# Patient Record
Sex: Female | Born: 1940 | Race: White | Hispanic: No | Marital: Single | Smoking: Never smoker
Health system: Southern US, Community
[De-identification: ages and names within clinical notes are randomized; demographics above are authoritative.]

## PROBLEM LIST (undated history)

## (undated) DIAGNOSIS — R569 Unspecified convulsions: Secondary | ICD-10-CM

## (undated) DIAGNOSIS — M40209 Unspecified kyphosis, site unspecified: Secondary | ICD-10-CM

## (undated) DIAGNOSIS — M4300 Spondylolysis, site unspecified: Secondary | ICD-10-CM

## (undated) DIAGNOSIS — S22080A Wedge compression fracture of T11-T12 vertebra, initial encounter for closed fracture: Secondary | ICD-10-CM

## (undated) DIAGNOSIS — M419 Scoliosis, unspecified: Secondary | ICD-10-CM

## (undated) DIAGNOSIS — G809 Cerebral palsy, unspecified: Secondary | ICD-10-CM

## (undated) DIAGNOSIS — F72 Severe intellectual disabilities: Secondary | ICD-10-CM

## (undated) SURGERY — OPEN REDUCTION OF HIP
Anesthesia: Choice | Laterality: Right

---

## 1998-01-31 ENCOUNTER — Other Ambulatory Visit: Admission: RE | Admit: 1998-01-31 | Discharge: 1998-01-31 | Payer: Self-pay | Admitting: Obstetrics and Gynecology

## 1998-02-03 ENCOUNTER — Encounter (HOSPITAL_COMMUNITY): Admission: RE | Admit: 1998-02-03 | Discharge: 1998-05-04 | Payer: Self-pay | Admitting: Family Medicine

## 1998-05-19 ENCOUNTER — Encounter (HOSPITAL_COMMUNITY): Admission: RE | Admit: 1998-05-19 | Discharge: 1998-08-17 | Payer: Self-pay | Admitting: Family Medicine

## 1999-08-23 ENCOUNTER — Encounter: Admission: RE | Admit: 1999-08-23 | Discharge: 1999-08-23 | Payer: Self-pay | Admitting: Family Medicine

## 2000-07-05 ENCOUNTER — Encounter: Payer: Self-pay | Admitting: Family Medicine

## 2000-07-05 ENCOUNTER — Encounter: Admission: RE | Admit: 2000-07-05 | Discharge: 2000-07-05 | Payer: Self-pay | Admitting: Family Medicine

## 2001-08-05 ENCOUNTER — Encounter: Payer: Self-pay | Admitting: Family Medicine

## 2001-08-05 ENCOUNTER — Encounter: Admission: RE | Admit: 2001-08-05 | Discharge: 2001-08-05 | Payer: Self-pay | Admitting: Family Medicine

## 2002-12-08 ENCOUNTER — Encounter: Payer: Self-pay | Admitting: Family Medicine

## 2002-12-08 ENCOUNTER — Encounter: Admission: RE | Admit: 2002-12-08 | Discharge: 2002-12-08 | Payer: Self-pay | Admitting: Family Medicine

## 2003-10-15 ENCOUNTER — Encounter: Admission: RE | Admit: 2003-10-15 | Discharge: 2003-10-15 | Payer: Self-pay | Admitting: Family Medicine

## 2005-02-28 ENCOUNTER — Encounter: Admission: RE | Admit: 2005-02-28 | Discharge: 2005-02-28 | Payer: Self-pay | Admitting: Family Medicine

## 2006-09-23 ENCOUNTER — Emergency Department (HOSPITAL_COMMUNITY): Admission: EM | Admit: 2006-09-23 | Discharge: 2006-09-23 | Payer: Self-pay | Admitting: Family Medicine

## 2007-03-26 ENCOUNTER — Encounter: Admission: RE | Admit: 2007-03-26 | Discharge: 2007-03-26 | Payer: Self-pay | Admitting: Family Medicine

## 2007-05-26 ENCOUNTER — Encounter: Admission: RE | Admit: 2007-05-26 | Discharge: 2007-05-26 | Payer: Self-pay | Admitting: Family Medicine

## 2008-03-26 ENCOUNTER — Encounter: Admission: RE | Admit: 2008-03-26 | Discharge: 2008-03-26 | Payer: Self-pay | Admitting: Family Medicine

## 2008-06-30 ENCOUNTER — Encounter: Admission: RE | Admit: 2008-06-30 | Discharge: 2008-06-30 | Payer: Self-pay | Admitting: Family Medicine

## 2009-03-01 ENCOUNTER — Encounter (INDEPENDENT_AMBULATORY_CARE_PROVIDER_SITE_OTHER): Payer: Self-pay | Admitting: Emergency Medicine

## 2009-03-01 ENCOUNTER — Encounter: Admission: RE | Admit: 2009-03-01 | Discharge: 2009-03-01 | Payer: Self-pay | Admitting: Family Medicine

## 2009-03-01 ENCOUNTER — Emergency Department (HOSPITAL_COMMUNITY): Admission: EM | Admit: 2009-03-01 | Discharge: 2009-03-01 | Payer: Self-pay | Admitting: Emergency Medicine

## 2009-03-01 ENCOUNTER — Ambulatory Visit: Payer: Self-pay | Admitting: Vascular Surgery

## 2009-03-09 ENCOUNTER — Encounter: Admission: RE | Admit: 2009-03-09 | Discharge: 2009-03-09 | Payer: Self-pay | Admitting: Family Medicine

## 2009-03-29 ENCOUNTER — Encounter: Admission: RE | Admit: 2009-03-29 | Discharge: 2009-03-29 | Payer: Self-pay | Admitting: Family Medicine

## 2009-05-18 ENCOUNTER — Encounter: Admission: RE | Admit: 2009-05-18 | Discharge: 2009-05-18 | Payer: Self-pay | Admitting: Family Medicine

## 2009-06-08 ENCOUNTER — Encounter: Admission: RE | Admit: 2009-06-08 | Discharge: 2009-06-08 | Payer: Self-pay | Admitting: Family Medicine

## 2009-12-26 ENCOUNTER — Encounter: Admission: RE | Admit: 2009-12-26 | Discharge: 2009-12-26 | Payer: Self-pay | Admitting: Family Medicine

## 2009-12-28 ENCOUNTER — Emergency Department (HOSPITAL_COMMUNITY): Admission: EM | Admit: 2009-12-28 | Discharge: 2009-12-28 | Payer: Self-pay | Admitting: Emergency Medicine

## 2010-03-29 ENCOUNTER — Encounter: Admission: RE | Admit: 2010-03-29 | Discharge: 2010-03-29 | Payer: Self-pay | Admitting: Family Medicine

## 2010-09-21 ENCOUNTER — Encounter: Admission: RE | Admit: 2010-09-21 | Discharge: 2010-09-21 | Payer: Self-pay | Admitting: Orthopaedic Surgery

## 2010-11-25 ENCOUNTER — Encounter: Payer: Self-pay | Admitting: Orthopaedic Surgery

## 2010-11-25 ENCOUNTER — Encounter: Payer: Self-pay | Admitting: Family Medicine

## 2010-11-26 ENCOUNTER — Encounter: Payer: Self-pay | Admitting: Family Medicine

## 2010-11-27 ENCOUNTER — Emergency Department (HOSPITAL_COMMUNITY)
Admission: EM | Admit: 2010-11-27 | Discharge: 2010-11-27 | Payer: Self-pay | Source: Home / Self Care | Admitting: Emergency Medicine

## 2011-01-09 IMAGING — CR DG FOOT COMPLETE 3+V*L*
3 series · 3 of 3 positions shown · non-contrast
Comparison: None

CLINICAL DATA: Left foot pain and swelling no known injury

LEFT FOOT - COMPLETE 3+ VIEW

[view not recorded (1 of 3)]
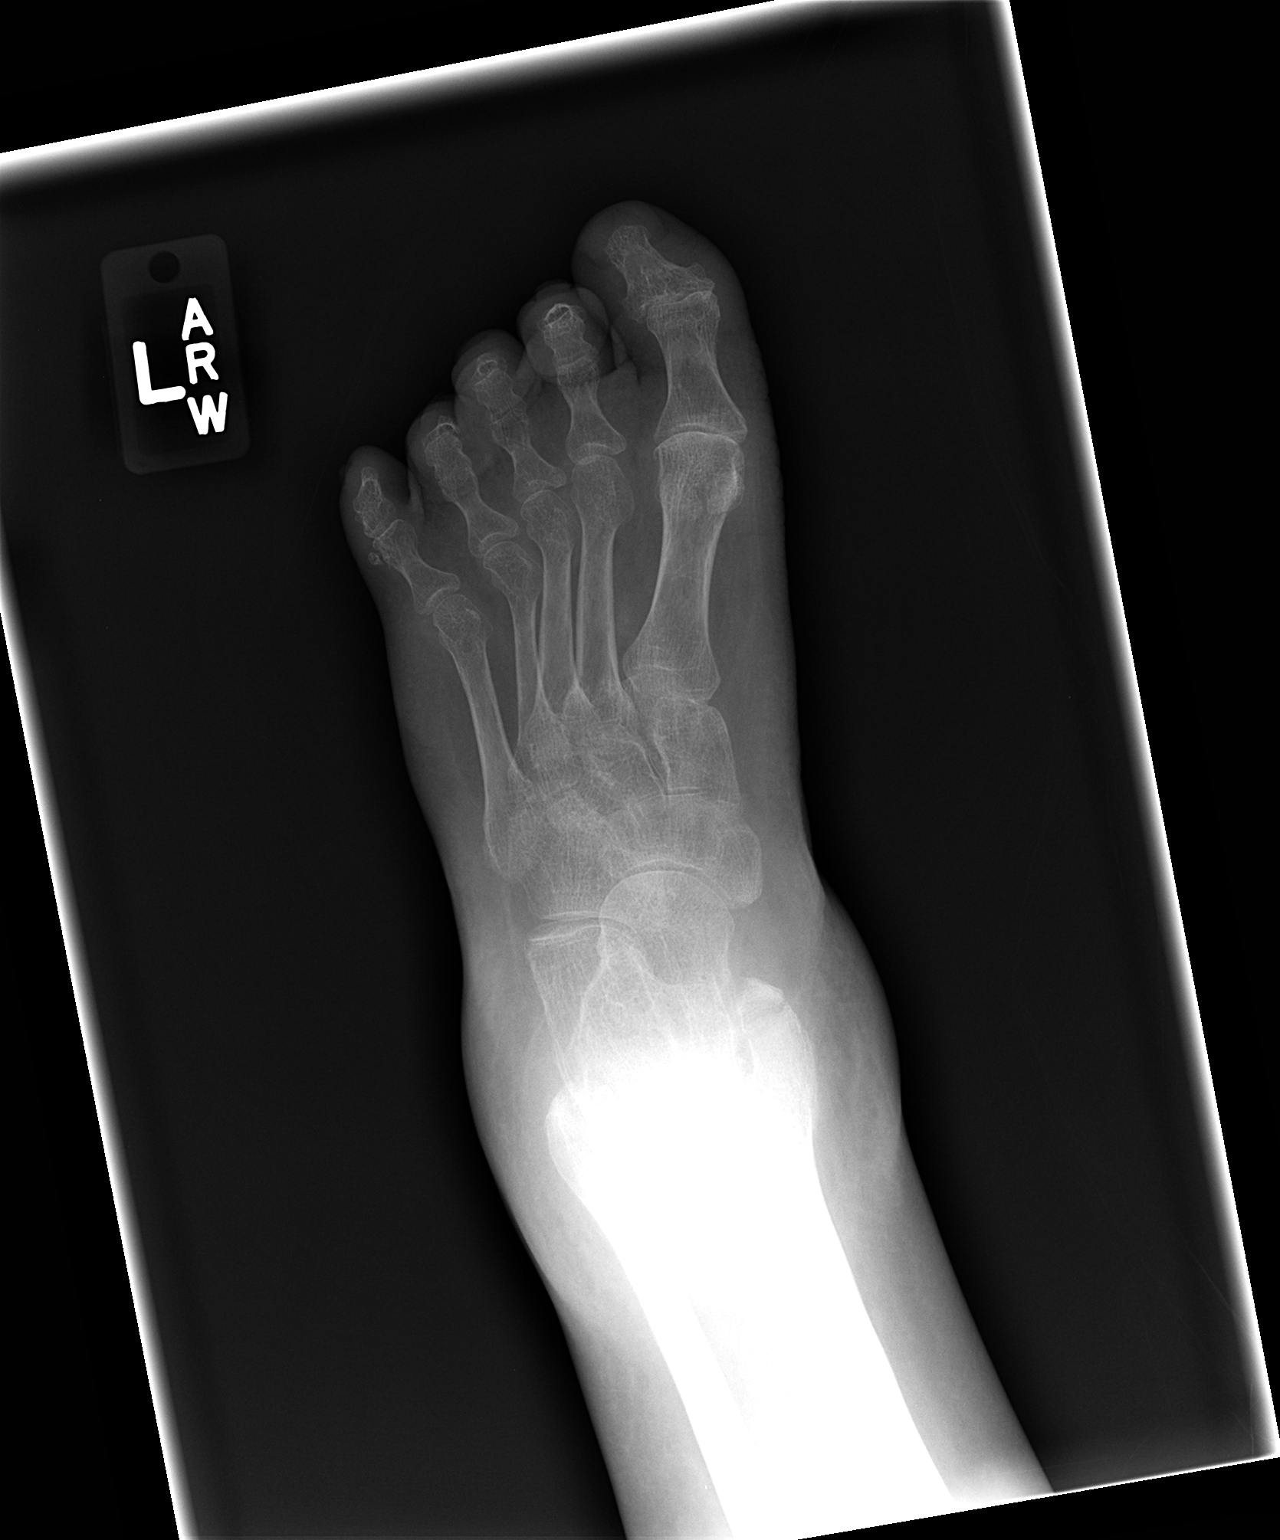

[view not recorded (2 of 3)]
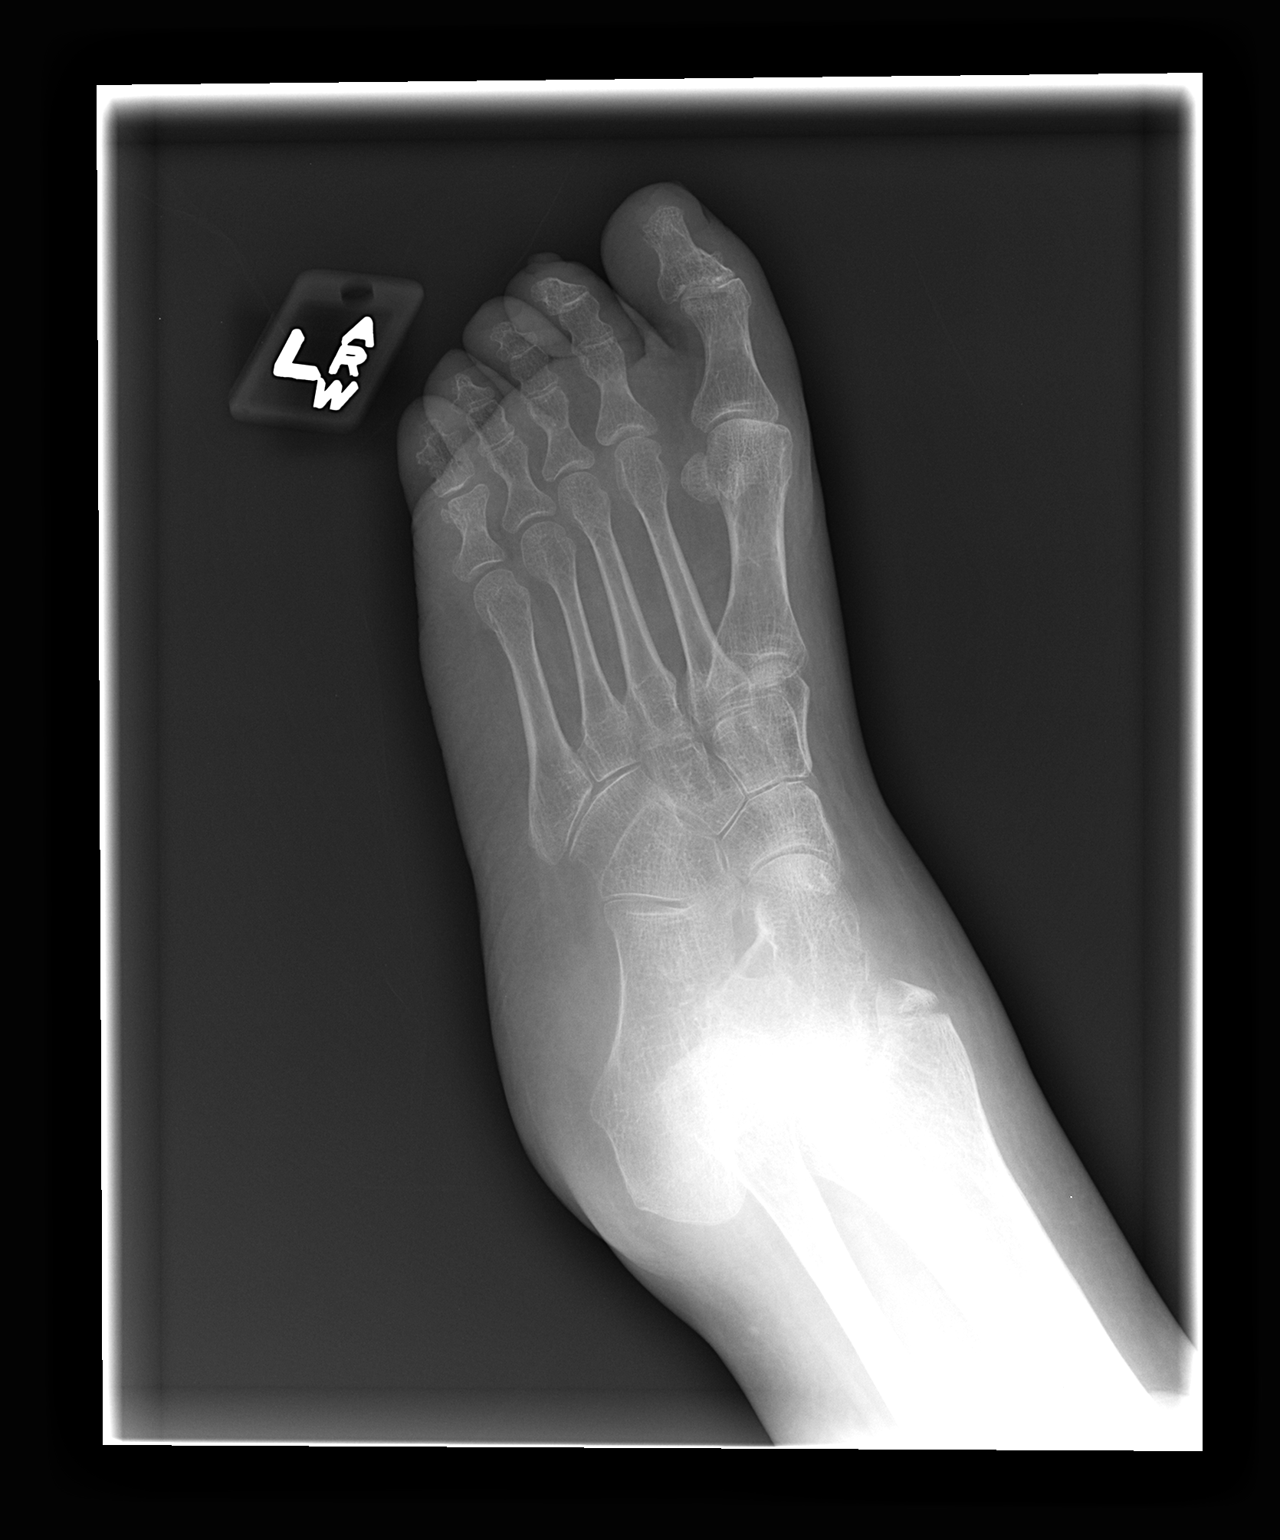

[view not recorded (3 of 3)]
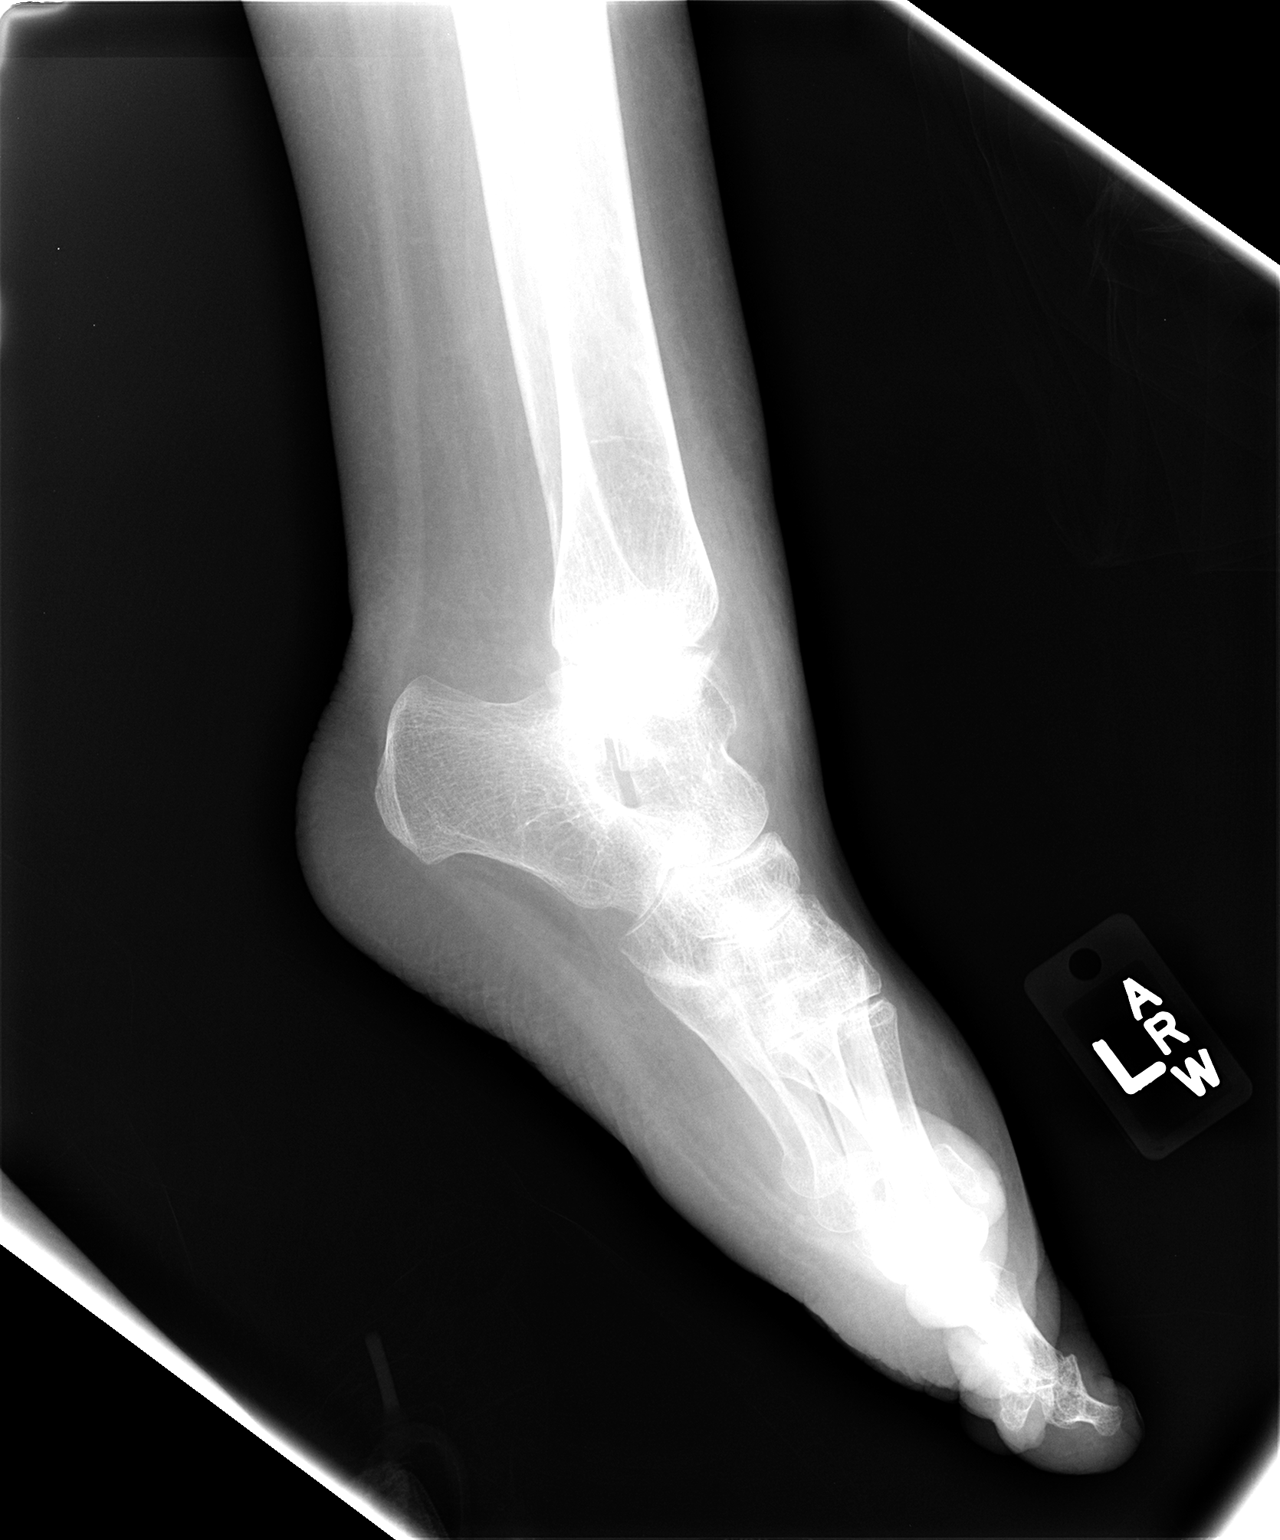

[3 of 3 positions shown; findings below may reference images not displayed]

FINDINGS: Three views provided.  No acute fracture or subluxation.
Diffuse osteopenia.  No radiopaque foreign body.  Soft tissue
swelling is noted dorsally in metatarsal region.
IMPRESSION: No acute fracture or subluxation.  Diffuse osteopenia.

## 2011-01-25 ENCOUNTER — Emergency Department (HOSPITAL_COMMUNITY)
Admission: EM | Admit: 2011-01-25 | Discharge: 2011-01-25 | Disposition: A | Discharge status: Home / Self Care | Payer: No Typology Code available for payment source | Attending: Emergency Medicine | Admitting: Emergency Medicine

## 2011-01-25 DIAGNOSIS — F79 Unspecified intellectual disabilities: Secondary | ICD-10-CM | POA: Insufficient documentation

## 2011-01-25 DIAGNOSIS — Y92410 Unspecified street and highway as the place of occurrence of the external cause: Secondary | ICD-10-CM | POA: Insufficient documentation

## 2011-01-25 DIAGNOSIS — T1490XA Injury, unspecified, initial encounter: Secondary | ICD-10-CM | POA: Insufficient documentation

## 2011-02-26 ENCOUNTER — Other Ambulatory Visit: Payer: Self-pay | Admitting: Family Medicine

## 2011-02-26 DIAGNOSIS — Z1231 Encounter for screening mammogram for malignant neoplasm of breast: Secondary | ICD-10-CM

## 2011-04-05 ENCOUNTER — Ambulatory Visit: Payer: No Typology Code available for payment source

## 2011-04-24 ENCOUNTER — Ambulatory Visit: Payer: No Typology Code available for payment source

## 2011-12-21 DIAGNOSIS — Z79899 Other long term (current) drug therapy: Secondary | ICD-10-CM | POA: Insufficient documentation

## 2011-12-21 DIAGNOSIS — M4000 Postural kyphosis, site unspecified: Secondary | ICD-10-CM | POA: Insufficient documentation

## 2011-12-21 DIAGNOSIS — M810 Age-related osteoporosis without current pathological fracture: Secondary | ICD-10-CM | POA: Insufficient documentation

## 2011-12-21 DIAGNOSIS — W2209XA Striking against other stationary object, initial encounter: Secondary | ICD-10-CM | POA: Insufficient documentation

## 2011-12-21 DIAGNOSIS — Y9210 Unspecified residential institution as the place of occurrence of the external cause: Secondary | ICD-10-CM | POA: Insufficient documentation

## 2011-12-21 DIAGNOSIS — S0100XA Unspecified open wound of scalp, initial encounter: Secondary | ICD-10-CM | POA: Insufficient documentation

## 2011-12-21 DIAGNOSIS — F72 Severe intellectual disabilities: Secondary | ICD-10-CM | POA: Insufficient documentation

## 2011-12-21 DIAGNOSIS — G809 Cerebral palsy, unspecified: Secondary | ICD-10-CM | POA: Insufficient documentation

## 2011-12-21 DIAGNOSIS — G40909 Epilepsy, unspecified, not intractable, without status epilepticus: Secondary | ICD-10-CM | POA: Insufficient documentation

## 2011-12-22 ENCOUNTER — Encounter (HOSPITAL_COMMUNITY): Payer: Self-pay | Admitting: *Deleted

## 2011-12-22 ENCOUNTER — Emergency Department (HOSPITAL_COMMUNITY)
Admission: EM | Admit: 2011-12-22 | Discharge: 2011-12-22 | Disposition: A | Discharge status: Home / Self Care | Payer: Medicare Other | Attending: Emergency Medicine | Admitting: Emergency Medicine

## 2011-12-22 DIAGNOSIS — S0990XA Unspecified injury of head, initial encounter: Secondary | ICD-10-CM

## 2011-12-22 DIAGNOSIS — S0101XA Laceration without foreign body of scalp, initial encounter: Secondary | ICD-10-CM

## 2011-12-22 HISTORY — DX: Unspecified kyphosis, site unspecified: M40.209

## 2011-12-22 HISTORY — DX: Severe intellectual disabilities: F72

## 2011-12-22 HISTORY — DX: Wedge compression fracture of T11-T12 vertebra, initial encounter for closed fracture: S22.080A

## 2011-12-22 HISTORY — DX: Unspecified convulsions: R56.9

## 2011-12-22 HISTORY — DX: Cerebral palsy, unspecified: G80.9

## 2011-12-22 HISTORY — DX: Scoliosis, unspecified: M41.9

## 2011-12-22 HISTORY — DX: Spondylolysis, site unspecified: M43.00

## 2011-12-22 NOTE — ED Provider Notes (Signed)
History     CSN: [id number]  Arrival date & time 12/21/11  2350   First MD Initiated Contact with Patient 12/22/11 0057      Chief Complaint  Patient presents with  . Head Laceration    (Consider location/radiation/quality/duration/timing/severity/associated sxs/prior treatment) HPI Comments: Patient here with caregivers from SNF where she is a resident - she was being pulled up in bed when they struck her head against the wooden window sill - here with left parietal scalp laceration - no LOC, patient is at her baseline.  No vomiting.  Has a history of CP and profound MR -   Patient is a 71 y.o. female presenting with scalp laceration. The history is provided by the nursing home and a caregiver. The history is limited by a developmental delay. No language interpreter was used.  Head Laceration This is a new problem. The current episode started today. The problem occurs rarely. The problem has been unchanged. Pertinent negatives include no coughing, fever, urinary symptoms or vomiting. The symptoms are aggravated by nothing. She has tried nothing for the symptoms. The treatment provided no relief.    Past Medical History  Diagnosis Date  . Severe mental retardation   . Kyphosis   . Osteoporosis   . Seizures   . Spondylolysis   . Compression fracture of T12 vertebra   . Scoliosis   . Cerebral palsy     History reviewed. No pertinent past surgical history.  History reviewed. No pertinent family history.  History  Substance Use Topics  . Smoking status: Never Smoker   . Smokeless tobacco: Not on file  . Alcohol Use: No    OB History    Grav Para Term Preterm Abortions TAB SAB Ect Mult Living                  Review of Systems  Unable to perform ROS: Other  Constitutional: Negative for fever.  Respiratory: Negative for cough.   Gastrointestinal: Negative for vomiting.    Allergies  Contrast media and Iodine  Home Medications   Current Outpatient Rx  Name  Route Sig Dispense Refill  . CALCITONIN (SALMON) 200 UNIT/ACT NA SOLN Nasal Place 1 spray into the nose every other day.    Marland Kitchen MAGNESIUM HYDROXIDE 400 MG/5ML PO SUSP Oral Take 15 mLs by mouth daily.    Jeralyn Bennett CALCIUM 500 MG PO TABS Oral Take 500-1,000 mg of elemental calcium by mouth 2 (two) times daily. Take 2 tablets every morning and take 1 tablet at bedtime    . VITAMIN D (ERGOCALCIFEROL) 50000 UNITS PO CAPS Oral Take 50,000 Units by mouth every 7 (seven) days. On wednesdays      BP 127/63  Pulse 88  Temp 98.1 F (36.7 C)  Resp 18  SpO2 100%  Physical Exam  Nursing note and vitals reviewed. Constitutional: She appears well-developed and well-nourished. No distress.  HENT:  Head: Normocephalic.  Right Ear: External ear normal.  Left Ear: External ear normal.  Nose: Nose normal.  Mouth/Throat: Oropharynx is clear and moist. No oropharyngeal exudate.       4cm laceration to left parietal scalp - hemostatic  Eyes: Conjunctivae are normal. Pupils are equal, round, and reactive to light. No scleral icterus.  Neck: Normal range of motion. Neck supple.       kyphosis  Cardiovascular: Normal rate, regular rhythm and normal heart sounds.  Exam reveals no gallop and no friction rub.   No murmur heard. Pulmonary/Chest: Effort normal  and breath sounds normal. No respiratory distress.  Abdominal: Soft. Bowel sounds are normal. She exhibits no distension. There is no tenderness.  Musculoskeletal:       MOE x 4  Lymphadenopathy:    She has no cervical adenopathy.  Neurological: She is alert.  Skin: Skin is warm and dry.  Psychiatric: She has a normal mood and affect. Her behavior is normal.    ED Course  Procedures (including critical care time)  Labs Reviewed - No data to display No results found.  LACERATION REPAIR Performed by: Patrecia Pour. Authorized by: Patrecia Pour Consent: Verbal consent obtained. Risks and benefits: risks, benefits and alternatives were  discussed Consent given by: patient Patient identity confirmed: provided demographic data Prepped and Draped in normal sterile fashion Wound explored  Laceration Location: left parietal scalp  Laceration Length: 4cm  No Foreign Bodies seen or palpated  Anesthesia: local infiltration  Local anesthetic: lidocaine 2% without epinephrine  Anesthetic total: 4 ml  Irrigation method: syringe Amount of cleaning: standard  Skin closure: staples  Number of sutures: 3  Technique: simple interrupted  Patient tolerance: Patient tolerated the procedure well with no immediate complications.\  Scalp laceration Minor head injury    MDM  Patient with minor head laceration, stapled, patient is at her baseline with caregiver's present, I do not feel the need to do CT scan at this time, no anticoagulants, Dr. Golda Acre in to see patient and will discharge in care of staff.  Patient to return if any change in mental status, seizure, vomiting or in 7 days for staple removal        Scarlette Calico C. [place], [place] 12/22/11 0121

## 2011-12-22 NOTE — ED Provider Notes (Signed)
Medical screening examination/treatment/procedure(s) were conducted as a shared visit with non-physician practitioner(s) and myself.  I personally evaluated the patient during the encounter   Patient was at her group home and had a fall without loss of consciousness.  Her laceration has been repaired here by the physician assistant.  The wound is closed and clean.  Patient is at her baseline mental status at this time per her caregivers who accompany her.  She is not on Coumadin.  The patient is safe for discharge back to her group home where she will continue to be monitored.  Nat Christen, MD 12/22/11 [phone]

## 2011-12-22 NOTE — ED Notes (Signed)
Provider at bedside for suture repair.

## 2011-12-22 NOTE — ED Provider Notes (Signed)
Medical screening examination/treatment/procedure(s) were conducted as a shared visit with non-physician practitioner(s) and myself.  I personally evaluated the patient during the encounter   Nat Christen, MD 12/22/11 [phone]

## 2011-12-22 NOTE — ED Notes (Signed)
Pt with severe cerebral palsy, profound MR, kyphosis, scoliosis was being slid up in bed, head hit wooden window sill causing head lac L parietal head, dressed with gauze and hat PTA, no obvious bleeding noted, no s/sx of pain.

## 2011-12-22 NOTE — Discharge Instructions (Signed)
Head Injury, Adult You have had a head injury that does not appear serious at this time. A concussion is a state of changed mental ability, usually from a blow to the head. You should take clear liquids for the rest of the day and then resume your regular diet. You should not take sedatives or alcoholic beverages for as long as directed by your caregiver after discharge. After injuries such as yours, most problems occur within the first 24 hours. SYMPTOMS These minor symptoms may be experienced after discharge:  Memory difficulties.   Dizziness.   Headaches.   Double vision.   Hearing difficulties.   Depression.   Tiredness.   Weakness.   Difficulty with concentration.  If you experience any of these problems, you should not be alarmed. A concussion requires a few days for recovery. Many patients with head injuries frequently experience such symptoms. Usually, these problems disappear without medical care. If symptoms last for more than one day, notify your caregiver. See your caregiver sooner if symptoms are becoming worse rather than better. HOME CARE INSTRUCTIONS   During the next 24 hours you must stay with someone who can watch you for the warning signs listed below.  Although it is unlikely that serious side effects will occur, you should be aware of signs and symptoms which may necessitate your return to this location. Side effects may occur up to 7 - 10 days following the injury. It is important for you to carefully monitor your condition and contact your caregiver or seek immediate medical attention if there is a change in your condition. SEEK IMMEDIATE MEDICAL CARE IF:   There is confusion or drowsiness.   You can not awaken the injured person.   There is nausea (feeling sick to your stomach) or continued, forceful vomiting.   You notice dizziness or unsteadiness which is getting worse, or inability to walk.   You have convulsions or unconsciousness.   You experience  severe, persistent headaches not relieved by over-the-counter or prescription medicines for pain. (Do not take aspirin as this impairs clotting abilities). Take other pain medications only as directed.   You can not use arms or legs normally.   There is clear or bloody discharge from the nose or ears.  MAKE SURE YOU:   Understand these instructions.   Will watch your condition.   Will get help right away if you are not doing well or get worse.  Document Released: 10/22/2005 Document Revised: 07/04/2011 Document Reviewed: 09/09/2009 [facility] Patient Information 2012 [place], [place].Laceration Care, Adult A laceration is a cut or lesion that goes through all layers of the skin and into the tissue just beneath the skin. TREATMENT  Some lacerations may not require closure. Some lacerations may not be able to be closed due to an increased risk of infection. It is important to see your caregiver as soon as possible after an injury to minimize the risk of infection and maximize the opportunity for successful closure. If closure is appropriate, pain medicines may be given, if needed. The wound will be cleaned to help prevent infection. Your caregiver will use stitches (sutures), staples, wound glue (adhesive), or skin adhesive strips to repair the laceration. These tools bring the skin edges together to allow for faster healing and a better cosmetic outcome. However, all wounds will heal with a scar. Once the wound has healed, scarring can be minimized by covering the wound with sunscreen during the day for 1 full year. HOME CARE INSTRUCTIONS  For sutures  or staples:  Keep the wound clean and dry.   If you were given a bandage (dressing), you should change it at least once a day. Also, change the dressing if it becomes wet or dirty, or as directed by your caregiver.   Wash the wound with soap and water 2 times a day. Rinse the wound off with water to remove all soap. Pat the wound dry with a clean  towel.   After cleaning, apply a thin layer of the antibiotic ointment as recommended by your caregiver. This will help prevent infection and keep the dressing from sticking.   You may shower as usual after the first 24 hours. Do not soak the wound in water until the sutures are removed.   Only take over-the-counter or prescription medicines for pain, discomfort, or fever as directed by your caregiver.   Get your sutures or staples removed as directed by your caregiver.  For skin adhesive strips:  Keep the wound clean and dry.   Do not get the skin adhesive strips wet. You may bathe carefully, using caution to keep the wound dry.   If the wound gets wet, pat it dry with a clean towel.   Skin adhesive strips will fall off on their own. You may trim the strips as the wound heals. Do not remove skin adhesive strips that are still stuck to the wound. They will fall off in time.  For wound adhesive:  You may briefly wet your wound in the shower or bath. Do not soak or scrub the wound. Do not swim. Avoid periods of heavy perspiration until the skin adhesive has fallen off on its own. After showering or bathing, gently pat the wound dry with a clean towel.   Do not apply liquid medicine, cream medicine, or ointment medicine to your wound while the skin adhesive is in place. This may loosen the film before your wound is healed.   If a dressing is placed over the wound, be careful not to apply tape directly over the skin adhesive. This may cause the adhesive to be pulled off before the wound is healed.   Avoid prolonged exposure to sunlight or tanning lamps while the skin adhesive is in place. Exposure to ultraviolet light in the first year will darken the scar.   The skin adhesive will usually remain in place for 5 to 10 days, then naturally fall off the skin. Do not pick at the adhesive film.  You may need a tetanus shot if:  You cannot remember when you had your last tetanus shot.   You  have never had a tetanus shot.  If you get a tetanus shot, your arm may swell, get red, and feel warm to the touch. This is common and not a problem. If you need a tetanus shot and you choose not to have one, there is a rare chance of getting tetanus. Sickness from tetanus can be serious. SEEK MEDICAL CARE IF:   You have redness, swelling, or increasing pain in the wound.   You see a red line that goes away from the wound.   You have yellowish-white fluid (pus) coming from the wound.   You have a fever.   You notice a bad smell coming from the wound or dressing.   Your wound breaks open before or after sutures have been removed.   You notice something coming out of the wound such as wood or glass.   Your wound is on your hand or   foot and you cannot move a finger or toe.  SEEK IMMEDIATE MEDICAL CARE IF:   Your pain is not controlled with prescribed medicine.   You have severe swelling around the wound causing pain and numbness or a change in color in your arm, hand, leg, or foot.   Your wound splits open and starts bleeding.   You have worsening numbness, weakness, or loss of function of any joint around or beyond the wound.   You develop painful lumps near the wound or on the skin anywhere on your body.  MAKE SURE YOU:   Understand these instructions.   Will watch your condition.   Will get help right away if you are not doing well or get worse.  Document Released: 10/22/2005 Document Revised: 07/04/2011 Document Reviewed: 04/17/2011 [facility] Patient Information 2012 [place], [place].Staple Wound Closure Your wound has been cleaned and closed with skin staples. HOME CARE  Keep the area around the staples clean and dry.   Rest and raise (elevate) the injured part above the level of your heart.   See your doctor for a follow-up check of the wound.   See your doctor to have the staples removed.   As the wound heals, you may leave it open to the air and clean it daily  with water.   Do not soak the wound in water for long periods of time.   Watch for signs of a wound infection:   Unusual redness or puffiness around the wound.   Increasing pain or tenderness.   Yellowish white fluid (pus) coming from the wound.  You may need a tetanus shot if:  You cannot remember when you had your last tetanus shot.   You have never had a tetanus shot.   The injury broke your skin.  If you need a tetanus shot and you choose not to have one, you may get tetanus. Sickness from tetanus can be serious. GET HELP RIGHT AWAY IF:   You think the wound is infected.   The wound does not stay together after the staples have been taken out.   Something comes out of the wound, such as wood or glass.   You or your child has problems moving the injured area.   You or your child has a temperature by mouth above 102 F (38.9 C), not controlled by medicine.  MAKE SURE YOU:   Understand these instructions.   Will watch this condition.   Will get help right away if you or your child is not doing well or gets worse.  Document Released: 07/31/2008 Document Revised: 07/04/2011 Document Reviewed: 10/20/2008 [facility] Patient Information 2012 [place], [place].

## 2012-08-29 ENCOUNTER — Other Ambulatory Visit: Payer: Self-pay | Admitting: Family Medicine

## 2012-08-29 DIAGNOSIS — Z1231 Encounter for screening mammogram for malignant neoplasm of breast: Secondary | ICD-10-CM

## 2012-09-05 ENCOUNTER — Other Ambulatory Visit: Payer: Medicare Other

## 2012-09-12 ENCOUNTER — Ambulatory Visit
Admission: RE | Admit: 2012-09-12 | Discharge: 2012-09-12 | Disposition: A | Discharge status: Home / Self Care | Payer: Medicare Other | Source: Ambulatory Visit | Attending: Family Medicine | Admitting: Family Medicine

## 2012-09-12 DIAGNOSIS — Z1231 Encounter for screening mammogram for malignant neoplasm of breast: Secondary | ICD-10-CM

## 2013-11-23 ENCOUNTER — Ambulatory Visit: Payer: Self-pay | Admitting: Podiatry

## 2013-12-08 ENCOUNTER — Other Ambulatory Visit: Payer: Self-pay | Admitting: Family Medicine

## 2013-12-08 DIAGNOSIS — Z1231 Encounter for screening mammogram for malignant neoplasm of breast: Secondary | ICD-10-CM

## 2013-12-31 ENCOUNTER — Ambulatory Visit: Payer: Medicare Other

## 2014-01-04 ENCOUNTER — Inpatient Hospital Stay (HOSPITAL_COMMUNITY)
Admission: AD | Admit: 2014-01-04 | Discharge: 2014-01-08 | DRG: 480 | Disposition: A | Discharge status: Home / Self Care | Payer: Medicare Other | Source: Ambulatory Visit | Attending: Internal Medicine | Admitting: Internal Medicine

## 2014-01-04 ENCOUNTER — Encounter (HOSPITAL_COMMUNITY): Payer: Self-pay

## 2014-01-04 DIAGNOSIS — S72001A Fracture of unspecified part of neck of right femur, initial encounter for closed fracture: Secondary | ICD-10-CM | POA: Diagnosis present

## 2014-01-04 DIAGNOSIS — G8250 Quadriplegia, unspecified: Secondary | ICD-10-CM | POA: Diagnosis present

## 2014-01-04 DIAGNOSIS — K5900 Constipation, unspecified: Secondary | ICD-10-CM | POA: Diagnosis present

## 2014-01-04 DIAGNOSIS — F72 Severe intellectual disabilities: Secondary | ICD-10-CM | POA: Diagnosis present

## 2014-01-04 DIAGNOSIS — Z8669 Personal history of other diseases of the nervous system and sense organs: Secondary | ICD-10-CM

## 2014-01-04 DIAGNOSIS — M4120 Other idiopathic scoliosis, site unspecified: Secondary | ICD-10-CM | POA: Diagnosis present

## 2014-01-04 DIAGNOSIS — Z7401 Bed confinement status: Secondary | ICD-10-CM

## 2014-01-04 DIAGNOSIS — S72143A Displaced intertrochanteric fracture of unspecified femur, initial encounter for closed fracture: Principal | ICD-10-CM | POA: Diagnosis present

## 2014-01-04 DIAGNOSIS — X58XXXA Exposure to other specified factors, initial encounter: Secondary | ICD-10-CM | POA: Diagnosis present

## 2014-01-04 DIAGNOSIS — S72009A Fracture of unspecified part of neck of unspecified femur, initial encounter for closed fracture: Secondary | ICD-10-CM

## 2014-01-04 DIAGNOSIS — G809 Cerebral palsy, unspecified: Secondary | ICD-10-CM | POA: Diagnosis present

## 2014-01-04 DIAGNOSIS — Z79899 Other long term (current) drug therapy: Secondary | ICD-10-CM

## 2014-01-04 DIAGNOSIS — R569 Unspecified convulsions: Secondary | ICD-10-CM | POA: Diagnosis present

## 2014-01-04 DIAGNOSIS — A498 Other bacterial infections of unspecified site: Secondary | ICD-10-CM | POA: Diagnosis present

## 2014-01-04 DIAGNOSIS — G808 Other cerebral palsy: Secondary | ICD-10-CM | POA: Diagnosis present

## 2014-01-04 DIAGNOSIS — Z87898 Personal history of other specified conditions: Secondary | ICD-10-CM

## 2014-01-04 DIAGNOSIS — D72829 Elevated white blood cell count, unspecified: Secondary | ICD-10-CM | POA: Diagnosis present

## 2014-01-04 DIAGNOSIS — M4000 Postural kyphosis, site unspecified: Secondary | ICD-10-CM | POA: Diagnosis present

## 2014-01-04 DIAGNOSIS — K5909 Other constipation: Secondary | ICD-10-CM | POA: Diagnosis present

## 2014-01-04 DIAGNOSIS — N390 Urinary tract infection, site not specified: Secondary | ICD-10-CM

## 2014-01-04 DIAGNOSIS — M810 Age-related osteoporosis without current pathological fracture: Secondary | ICD-10-CM | POA: Diagnosis present

## 2014-01-04 LAB — BASIC METABOLIC PANEL
BUN: 38 mg/dL — AB (ref 6–23)
CHLORIDE: 105 meq/L (ref 96–112)
CO2: 28 mEq/L (ref 19–32)
Calcium: 9.3 mg/dL (ref 8.4–10.5)
Creatinine, Ser: 0.53 mg/dL (ref 0.50–1.10)
GFR calc Af Amer: >90 mL/min (ref >90)
GLUCOSE: 128 mg/dL — AB (ref 70–99)
Potassium: 4.2 mEq/L (ref 3.7–5.3)
Sodium: 145 mEq/L (ref 137–147)

## 2014-01-04 LAB — CBC
HEMATOCRIT: 37.3 % (ref 36.0–46.0)
HEMOGLOBIN: 12.4 g/dL (ref 12.0–15.0)
MCH: 29.2 pg (ref 26.0–34.0)
MCHC: 33.2 g/dL (ref 30.0–36.0)
MCV: 88 fL (ref 78.0–100.0)
Platelets: 372 10*3/uL (ref 150–400)
RBC: 4.24 MIL/uL (ref 3.87–5.11)
RDW: 14 % (ref 11.5–15.5)
WBC: 13.3 10*3/uL — ABNORMAL HIGH (ref 4.0–10.5)

## 2014-01-04 LAB — ABO/RH: ABO/RH(D): O POS

## 2014-01-04 MED ORDER — HYDROCODONE-ACETAMINOPHEN 5-325 MG PO TABS
1.0000 | ORAL_TABLET | Freq: Four times a day (QID) | ORAL | Status: DC | PRN
  Administered 2014-01-04 – 2014-01-06 (×3): 1 via ORAL
  Filled 2014-01-04 (×3): qty 1

## 2014-01-04 MED ORDER — VITAMIN D (ERGOCALCIFEROL) 1.25 MG (50000 UNIT) PO CAPS: 50000.0000 [IU] | ORAL_CAPSULE | ORAL | Status: DC

## 2014-01-04 MED ORDER — DOCUSATE SODIUM 100 MG PO CAPS: 100.0000 mg | ORAL_CAPSULE | Freq: Two times a day (BID) | ORAL | Status: DC

## 2014-01-04 MED ORDER — ACETAMINOPHEN 325 MG PO TABS
650.0000 mg | ORAL_TABLET | Freq: Four times a day (QID) | ORAL | Status: DC | PRN
  Administered 2014-01-07 (×3): 650 mg via ORAL
  Filled 2014-01-04 (×3): qty 2

## 2014-01-04 MED ORDER — CALCITONIN (SALMON) 200 UNIT/ACT NA SOLN
1.0000 | NASAL | Status: DC
  Administered 2014-01-05: 1 via NASAL
  Filled 2014-01-04: qty 3.7

## 2014-01-04 MED ORDER — HEPARIN SODIUM (PORCINE) 5000 UNIT/ML IJ SOLN
5000.0000 [IU] | Freq: Three times a day (TID) | INTRAMUSCULAR | Status: DC
  Administered 2014-01-04 – 2014-01-06 (×5): 5000 [IU] via SUBCUTANEOUS
  Filled 2014-01-04 (×8): qty 1

## 2014-01-04 MED ORDER — OYSTER CALCIUM 500 MG PO TABS: 500.0000 mg | ORAL_TABLET | Freq: Two times a day (BID) | ORAL | Status: DC

## 2014-01-04 MED ORDER — POLYETHYLENE GLYCOL 3350 17 G PO PACK
17.0000 g | PACK | Freq: Two times a day (BID) | ORAL | Status: DC
  Administered 2014-01-04 – 2014-01-08 (×5): 17 g via ORAL
  Filled 2014-01-04 (×10): qty 1

## 2014-01-04 MED ORDER — BISACODYL 10 MG RE SUPP: 10.0000 mg | Freq: Every day | RECTAL | Status: DC | PRN

## 2014-01-04 MED ORDER — METHOCARBAMOL 500 MG PO TABS
500.0000 mg | ORAL_TABLET | Freq: Four times a day (QID) | ORAL | Status: DC | PRN
  Filled 2014-01-04: qty 1

## 2014-01-04 MED ORDER — CALCIUM CARBONATE-VITAMIN D 500-200 MG-UNIT PO TABS
1.0000 | ORAL_TABLET | Freq: Two times a day (BID) | ORAL | Status: DC
  Administered 2014-01-05 – 2014-01-08 (×4): 1 via ORAL
  Filled 2014-01-04 (×9): qty 1

## 2014-01-04 MED ORDER — MAGNESIUM HYDROXIDE 400 MG/5ML PO SUSP
15.0000 mL | Freq: Every day | ORAL | Status: DC
  Administered 2014-01-04: 15 mL via ORAL
  Administered 2014-01-05: 11:00:00 via ORAL
  Administered 2014-01-08: 15 mL via ORAL
  Filled 2014-01-04 (×6): qty 30

## 2014-01-04 MED ORDER — SENNOSIDES-DOCUSATE SODIUM 8.6-50 MG PO TABS: 1.0000 | ORAL_TABLET | Freq: Every evening | ORAL | Status: DC | PRN

## 2014-01-04 MED ORDER — FLEET ENEMA 7-19 GM/118ML RE ENEM: 1.0000 | ENEMA | Freq: Once | RECTAL | Status: AC | PRN

## 2014-01-04 MED ORDER — METHOCARBAMOL 100 MG/ML IJ SOLN
500.0000 mg | Freq: Four times a day (QID) | INTRAVENOUS | Status: DC | PRN
  Filled 2014-01-04: qty 5

## 2014-01-04 NOTE — Progress Notes (Signed)
Orthopedic Tech Progress Note Patient Details:  Domenica FailCarol A Theissen September 12, 1941 [id number]  Patient ID: Domenica Failarol A Teare, female   DOB: September 12, 1941, 73 y.o.   MRN: [id number]   Shawnie PonsCammer, Yesennia Hirota Chasitie 01/04/2014, 4:14 PMTrapeze bar

## 2014-01-04 NOTE — H&P (Signed)
[facility] History and Physical  Pamela Richard VWU:[id number] DOB: 09/20/1941 DOA: 01/04/2014  Referring physician: Luiz Blare PCP: No primary provider on file.   Chief Complaint: right hip fracture  HPI: Pamela Richard is a 73 y.o. female  Severe mental retardation, cerebral palsy, bed and chair bound directly admitted from Dr. Luiz Blare office with right intertrochanteric fracture. Pt does not communicate. All history is per caregiver and outpatient chart.  She lives in a group home and last week was noted by be crying frequently.  Eventually, a right hip fracture was found.  No known fall or trauma.  Surgery is scheduled for Wednesday. No other reported problems   Review of Systems:  Unable due to patient factors  Past Medical History  Diagnosis Date  . Severe mental retardation   . Kyphosis   . Osteoporosis   . Seizures   . Spondylolysis   . Compression fracture of T12 vertebra   . Scoliosis   . Cerebral palsy    Surgical history: none  Social History: no smoking or drinking history. Brother is HCPOA  Allergies  Allergen Reactions  . Contrast Media [Iodinated Diagnostic Agents] Other (See Comments)    unknown  . Iodine Other (See Comments)    unknown    FH: unable due to patient factors   Medications Prior to Admission  Medication Sig Dispense Refill  . calcitonin, salmon, (MIACALCIN/FORTICAL) 200 UNIT/ACT nasal spray Place 1 spray into the nose every other day.      . Calcium Carbonate-Vitamin D (CALCIUM 600+D) 600-400 MG-UNIT per tablet Take 1 tablet by mouth daily.      . magnesium hydroxide (MILK OF MAGNESIA) 400 MG/5ML suspension Take 15 mLs by mouth daily.      . polyethylene glycol (MIRALAX / GLYCOLAX) packet Take 17 g by mouth 2 (two) times daily.        Physical Exam: There were no vitals filed for this visit.  There were no vitals taken for this visit.  BP 141/63  Pulse 91  Temp(Src) 98.9 F (37.2 C) (Oral)  Resp 18  SpO2 99%  General  Appearance:    Noncommunicative. Appears anxious. Does not follow commands  Head:    Normocephalic, without obvious abnormality, atraumatic  Eyes:    PERRL, conjunctiva/corneas clear, EOM's intact, fundi    benign, both eyes     Nose:   Nares normal, septum midline, mucosa normal, no drainage    or sinus tenderness  Throat:   Poor dentition  Neck:   Flexed chronically  Back:     Severe kyphosis  Lungs:     Clear to auscultation bilaterally, respirations unlabored      Heart:    Regular rate and rhythm, S1 and S2 normal, no murmur, rub   or gallop     Abdomen:     Soft, non-tender, bowel sounds active all four quadrants,    no masses, no organomegaly  Genitalia:   deferred  Rectal:   deferred  Extremities:   Nonpitting edema. Right leg short  Pulses:   2+ and symmetric all extremities  Skin:   Skin color, texture, turgor normal, no rashes or lesions  Lymph nodes:   Cervical, supraclavicular, and axillary nodes normal  Neurologic:   No focal weakness noted. nonverbal             Psych: anxious  No labs  Assessment/Plan Principal Problem:   Closed right hip fracture: check baseline labs, EKG. Surgery WEd. Heparin SQ, teds, SCDs,  pain meds Active Problems:   History of seizure, not on AED   Cerebral palsy   Spastic quadriplegia   Chronic constipation: laxatives   Code Status: full Family Communication: *caregiver Disposition Plan: likely SNF  Time spent: 60 min  Ronith Berti L [facility] Pager [phone]  Addendum: low grade fever and leukocytosis.  Will check UA and CXR.

## 2014-01-04 NOTE — Progress Notes (Signed)
Direct admission from Dr. Luiz BlareGraves office to floor for hip fracture.  Has h/o severe mental retardation, cerebral palsy, seizures from group home. PCP unknown. HCPOA out of town until Wednesday, so surgery planned for that day.  Call flow manager on arrival to floor.  Crista Curborinna Jaid Quirion, M.D. [facility]

## 2014-01-04 NOTE — Consult Note (Signed)
Reason for Consult:right intertrochanteric hip fracture Referring Physician: hospitalists  Pamela Richard is an 73 y.o. female.  HPI: the patient is a 73 year old female who is severely emotionally involved and noncommunicative.  She is living in a group home and has been on anticonvulsant therapy long-term.  She is a nonambulator.  She began complaining of pain according to the staff last Wednesday and x-rays were taken which showed questionable fracture.  Repeat x-rays show greater concern for fracture.  She presented to the office today and evaluation and x-rays show that she has an intertrochanteric hip fracture on the right side.  She is admitted to the medicine service and we are consult for treatment of the right fintertrochanteric hip fracture.  Past Medical History  Diagnosis Date  . Severe mental retardation   . Kyphosis   . Osteoporosis   . Seizures   . Spondylolysis   . Compression fracture of T12 vertebra   . Scoliosis   . Cerebral palsy     History reviewed. No pertinent past surgical history.  History reviewed. No pertinent family history.  Social History:  reports that she has never smoked. She does not have any smokeless tobacco history on file. She reports that she does not drink alcohol. Her drug history is not on file.  Allergies:  Allergies  Allergen Reactions  . Contrast Media [Iodinated Diagnostic Agents] Other (See Comments)    unknown  . Iodine Other (See Comments)    unknown    Medications: I have reviewed the patient's current medications.  Results for orders placed during the hospital encounter of 01/04/14 (from the past 48 hour(s))  CBC     Status: Abnormal   Collection Time    01/04/14  2:55 PM      Result Value Ref Range   WBC 13.3 (*) 4.0 - 10.5 K/uL   RBC 4.24  3.87 - 5.11 MIL/uL   Hemoglobin 12.4  12.0 - 15.0 g/dL   HCT 37.3  36.0 - 46.0 %   MCV 88.0  78.0 - 100.0 fL   MCH 29.2  26.0 - 34.0 pg   MCHC 33.2  30.0 - 36.0 g/dL   RDW 14.0   11.5 - 15.5 %   Platelets 372  150 - 400 K/uL  BASIC METABOLIC PANEL     Status: Abnormal   Collection Time    01/04/14  2:55 PM      Result Value Ref Range   Sodium 145  137 - 147 mEq/L   Potassium 4.2  3.7 - 5.3 mEq/L   Chloride 105  96 - 112 mEq/L   CO2 28  19 - 32 mEq/L   Glucose, Bld 128 (*) 70 - 99 mg/dL   BUN 38 (*) 6 - 23 mg/dL   Creatinine, Ser 0.53  0.50 - 1.10 mg/dL   Calcium 9.3  8.4 - 10.5 mg/dL   GFR calc non Af Amer >90  >90 mL/min   GFR calc Af Amer >90  >90 mL/min   Comment: (NOTE)     The eGFR has been calculated using the CKD EPI equation.     This calculation has not been validated in all clinical situations.     eGFR's persistently <90 mL/min signify possible Chronic Kidney     Disease.  TYPE AND SCREEN     Status: None   Collection Time    01/04/14  2:55 PM      Result Value Ref Range   ABO/RH(D) O POS  Antibody Screen NEG     Sample Expiration 01/07/2014    ABO/RH     Status: None   Collection Time    01/04/14  2:55 PM      Result Value Ref Range   ABO/RH(D) O POS      No results found.  ROS ROS: I have reviewed the patient's review of systems thoroughly and there are no positive responses as relates to the HPI. EXAM  Blood pressure 161/80, pulse 87, temperature 100.3 F (37.9 C), temperature source Oral, resp. rate 18, SpO2 99.00%. Physical Exam Well-developed well-nourished patient in no acute distress. Alert and oriented x3 HEENT:within normal limits Cardiac: Regular rate and rhythm Pulmonary: Lungs clear to auscultation Abdomen: Soft and nontender.  Normal active bowel sounds  Musculoskeletal: right hip pain through range of motion.  Neurovascular intact.  X-ray: Right hip x-rays showed displaced intertrochanteric hip fracture right side. Assessment/Plan: 73 year old severely involved cerebral palsy patient with new onset hip fracture and pain with activities of daily living.  I had a prolonged discussion with the patient's caregiver  today as well as with her brother who is her medical power of attorney and we feel that the appropriate course of action for her is going to be open reduction and internal fixation to try to minimize her ongoing pain.  Obviously that she is at significant risk with her medical issues and inability to follow commands.  Certainly issues could arise with her surgical treatment but clearly we are concerned about nonoperative treatment as well with issues of pneumonia and bed sores et Ronney Asters.  After long discussion with her brother we feel that the appropriate course of action is to proceed with surgical intervention.  We will plan on proceeding with surgical intervention on Wednesday.I have had a prolonged discussion with the patient regarding the risk and benefits of the surgical procedure.  The patient understands the risks include but are not limited to bleeding, infection and failure of the surgery to cure the problem and need for further surgery.  The patient understands there is a slight risk of death at the time of surgery.  The patient understands these risks along with the potential benefits and wishes to proceed with surgical intervention.  The patient will discuss the full surgical procedure with Manuela Schwartz, our surgical scheduler, and the surgery will be set up at the patient's convenience.  The patient will be followed in the office in the postoperative period.  Keilynn Marano L 01/04/2014, 9:19 PM

## 2014-01-05 ENCOUNTER — Inpatient Hospital Stay (HOSPITAL_COMMUNITY): Payer: Medicare Other

## 2014-01-05 DIAGNOSIS — N390 Urinary tract infection, site not specified: Secondary | ICD-10-CM | POA: Diagnosis present

## 2014-01-05 LAB — URINE MICROSCOPIC-ADD ON

## 2014-01-05 LAB — URINALYSIS, ROUTINE W REFLEX MICROSCOPIC
BILIRUBIN URINE: NEGATIVE
Glucose, UA: NEGATIVE mg/dL
Hgb urine dipstick: NEGATIVE
Ketones, ur: NEGATIVE mg/dL
Nitrite: POSITIVE — AB
PH: 8 (ref 5.0–8.0)
Protein, ur: 30 mg/dL — AB
SPECIFIC GRAVITY, URINE: 1.036 — AB (ref 1.005–1.030)
UROBILINOGEN UA: 1 mg/dL (ref 0.0–1.0)

## 2014-01-05 LAB — SURGICAL PCR SCREEN
MRSA, PCR: NEGATIVE
STAPHYLOCOCCUS AUREUS: NEGATIVE

## 2014-01-05 MED ORDER — DEXTROSE 5 % IV SOLN
1.0000 g | INTRAVENOUS | Status: DC
  Administered 2014-01-05 – 2014-01-06 (×2): 1 g via INTRAVENOUS
  Filled 2014-01-05 (×4): qty 10

## 2014-01-05 MED ORDER — ENSURE COMPLETE PO LIQD
237.0000 mL | Freq: Two times a day (BID) | ORAL | Status: DC
  Administered 2014-01-05 – 2014-01-08 (×3): 237 mL via ORAL

## 2014-01-05 MED ORDER — CHLORHEXIDINE GLUCONATE 4 % EX LIQD
60.0000 mL | Freq: Once | CUTANEOUS | Status: AC
  Administered 2014-01-06: 4 via TOPICAL
  Filled 2014-01-05: qty 60

## 2014-01-05 NOTE — Progress Notes (Signed)
INITIAL NUTRITION ASSESSMENT  DOCUMENTATION CODES Per approved criteria  -Not Applicable   INTERVENTION:  1. Ensure Complete po BID, each supplement provides 350 kcal and 13 grams of protein  2. Magic cup TID with meals, each supplement provides 290 kcal and 9 grams of protein   NUTRITION DIAGNOSIS: Increased nutrient needs related to fracture as evidenced by estimated needs.   Goal: Pt to meet >/= 90% of their estimated nutrition needs   Monitor:  PO intake, supplement acceptance, weight trend, labs  Reason for Assessment: Pt identified as at nutrition risk on the Malnutrition Screen Tool  73 y.o. female  Admitting Dx: Closed right hip fracture  ASSESSMENT: Pt with hx of severe MR who was living in a group home. Pt admitted with close hip fx. Surgery scheduled for 3/4. Plan for SNF at d/c.  Pt is nonverbal and unable to provide any hx. Review of records show that pt was eating well PTA and was at a stable weight. Per nurse tech pt is refusing to eat foods but has taken applesauce and magic cups and is drinking really well.  Nutrition-focused physical exam deferred, pt grabs at my hands when attempting exam.   Height: Ht Readings from Last 1 Encounters:  01/05/14 5' 1.5" (1.562 m)    Weight: Wt Readings from Last 1 Encounters:  01/05/14 112 lb (50.803 kg)    Ideal Body Weight: 48.8 kg   % Ideal Body Weight: 96%  Wt Readings from Last 10 Encounters:  01/05/14 112 lb (50.803 kg)    Usual Body Weight: 112 lb per records  % Usual Body Weight: 100%  BMI:  Body mass index is 20.82 kg/(m^2).  Estimated Nutritional Needs: Kcal: 1300-1500 Protein: 65-75 grams Fluid: > 1.5 L/day  Skin: no issues noted  Diet Order: Dysphagia Meal Completion: 10%  EDUCATION NEEDS: -No education needs identified at this time   Intake/Output Summary (Last 24 hours) at 01/05/14 1340 Last data filed at 01/05/14 0900  Gross per 24 hour  Intake    720 ml  Output      0 ml   Net    720 ml    Last BM: PTA   Labs:   Recent Labs Lab 01/04/14 1455  NA 145  K 4.2  CL 105  CO2 28  BUN 38*  CREATININE 0.53  CALCIUM 9.3  GLUCOSE 128*    CBG (last 3)  No results found for this basename: GLUCAP,  in the last 72 hours  Scheduled Meds: . calcitonin (salmon)  1 spray Alternating Nares QODAY  . calcium-vitamin D  1 tablet Oral BID WC  . cefTRIAXone (ROCEPHIN)  IV  1 g Intravenous Q24H  . [START ON 01/06/2014] chlorhexidine  60 mL Topical Once  . heparin  5,000 Units Subcutaneous 3 times per day  . magnesium hydroxide  15 mL Oral Daily  . polyethylene glycol  17 g Oral BID    Continuous Infusions:   Past Medical History  Diagnosis Date  . Severe mental retardation   . Kyphosis   . Osteoporosis   . Seizures   . Spondylolysis   . Compression fracture of T12 vertebra   . Scoliosis   . Cerebral palsy     History reviewed. No pertinent past surgical history.  Kendell BaneHeather Irisha Grandmaison RD, LDN, CNSC [phone] Pager [phone] After Hours Pager

## 2014-01-05 NOTE — Progress Notes (Addendum)
Subjective: The patient is scheduled for right hip ORIF tomorrow (Wednesday) at 1:30 PM.  The patient is noncommunicative verbally, but does wince with palpation of the right hip and with right hip motion.  Objective: Vital signs in last 24 hours: Temp:  [98.6 F (37 C)-100.3 F (37.9 C)] 98.6 F (37 C) (03/03 19140613) Pulse Rate:  [87-95] 95 (03/03 0613) Resp:  [16-18] 18 (03/03 0613) BP: (141-161)/(62-80) 145/62 mmHg (03/03 0613) SpO2:  [99 %] 99 % (03/03 0613)  Intake/Output from previous day: 03/02 0701 - 03/03 0700 In: 600 [P.O.:600] Out: -  Intake/Output this shift: Total I/O In: 120 [P.O.:120] Out: -    Recent Labs  01/04/14 1455  HGB 12.4    Recent Labs  01/04/14 1455  WBC 13.3*  RBC 4.24  HCT 37.3  PLT 372    Recent Labs  01/04/14 1455  NA 145  K 4.2  CL 105  CO2 28  BUN 38*  CREATININE 0.53  GLUCOSE 128*  CALCIUM 9.3   No results found for this basename: LABPT, INR,  in the last 72 hours  Right hip exam: Tender to palpation.  Pain with motion.  The right leg is shortened and rotated.  Assessment/Plan: Intertrochanteric fracture right hip. Plan: The patient is scheduled for ORIF of her right hip tomorrow at 1:30. Continue medical care per hospitalists. NPO after midnight tonight Heparin should be discontinued tonight around midnight.  Todd Jelinski G 01/05/2014, 10:26 AM

## 2014-01-05 NOTE — Progress Notes (Signed)
[facility] PROGRESS NOTE  Pamela FailCarol A Richard WUJ:[id number] DOB: [birth year] DOA: 01/04/2014 PCP: No primary provider on file.  Assessment/Plan: #1 right hip intertrochanteric fracture. Patient scheduled for ORIF of the right hip tomorrow. Per orthopedics.  #2 urinary tract infection Urine cultures are pending. Start IV Rocephin.  #3 leukocytosis Likely secondary to problem #2. Chest x-ray is negative for any acute infiltrates. Urine cultures are pending. Follow.  #4 Cerebral Palsy/Spastic quadriplegia Stable.  #5 Seizure   #6 Chronic constipation Laxative    Code Status: full Family Communication: no family present. Disposition Plan: SNF when medically stable.   Consultants:  Orthopedics: Dr. Luiz BlareGraves 01/04/2014  Procedures:  Chest x-ray 01/05/2014  Antibiotics:  Rocephin 01/05/2013  HPI/Subjective: Patient noncommunicative.  Objective: Filed Vitals:   01/05/14 1351  BP: 146/70  Pulse: 90  Temp: 98.2 F (36.8 C)  Resp: 18    Intake/Output Summary (Last 24 hours) at 01/05/14 1636 Last data filed at 01/05/14 1300  Gross per 24 hour  Intake    720 ml  Output      0 ml  Net    720 ml   Filed Weights   01/05/14 1100  Weight: 50.803 kg (112 lb)    Exam:   General:  NAD  Cardiovascular: RRR  Respiratory: CTAB  Abdomen: soft, nontender, nondistended, positive bowel sounds.  Musculoskeletal: no clubbing cyanosis or edema  Data Reviewed: Basic Metabolic Panel:  Recent Labs Lab 01/04/14 1455  NA 145  K 4.2  CL 105  CO2 28  GLUCOSE 128*  BUN 38*  CREATININE 0.53  CALCIUM 9.3   Liver Function Tests: No results found for this basename: AST, ALT, ALKPHOS, BILITOT, PROT, ALBUMIN,  in the last 168 hours No results found for this basename: LIPASE, AMYLASE,  in the last 168 hours No results found for this basename: AMMONIA,  in the last 168 hours CBC:  Recent Labs Lab 01/04/14 1455  WBC 13.3*  HGB 12.4  HCT 37.3  MCV 88.0  PLT  372   Cardiac Enzymes: No results found for this basename: CKTOTAL, CKMB, CKMBINDEX, TROPONINI,  in the last 168 hours BNP (last 3 results) No results found for this basename: PROBNP,  in the last 8760 hours CBG: No results found for this basename: GLUCAP,  in the last 168 hours  No results found for this or any previous visit (from the past 240 hour(s)).   Studies: Dg Chest Port 1 View  01/05/2014   CLINICAL DATA:  Fever  EXAM: PORTABLE CHEST - 1 VIEW  COMPARISON:  DG CHEST 1 VIEW dated 11/27/2010  FINDINGS: Focal opacity at the retrocardiac left base is nonspecific and unchanged. Lungs otherwise clear. No pneumothorax. Normal heart size. Normal vascularity. Interstitial and bronchitic changes have a chronic appearance.  IMPRESSION: Focal opacity at the left base. Airspace disease is not excluded. It was seen previously and may represent a chronic finding. Two view upright study may be helpful.   Electronically Signed   By: Maryclare BeanArt  Hoss M.D.   On: 01/05/2014 08:11    Scheduled Meds: . calcitonin (salmon)  1 spray Alternating Nares QODAY  . calcium-vitamin D  1 tablet Oral BID WC  . cefTRIAXone (ROCEPHIN)  IV  1 g Intravenous Q24H  . [START ON 01/06/2014] chlorhexidine  60 mL Topical Once  . feeding supplement (ENSURE COMPLETE)  237 mL Oral BID BM  . heparin  5,000 Units Subcutaneous 3 times per day  . magnesium hydroxide  15 mL Oral Daily  .  polyethylene glycol  17 g Oral BID   Continuous Infusions:   Principal Problem:   Closed right hip fracture Active Problems:   History of seizure   Cerebral palsy   Spastic quadriplegia   Chronic constipation   UTI (urinary tract infection)    Time spent: 35 mins    [facility] MD [facility] Pager [phone]. If 7PM-7AM, please contact night-coverage at [email or web address], password [facility] 01/05/2014, 4:36 PM  LOS: 1 day

## 2014-01-06 ENCOUNTER — Inpatient Hospital Stay (HOSPITAL_COMMUNITY): Payer: Medicare Other | Admitting: Anesthesiology

## 2014-01-06 ENCOUNTER — Encounter (HOSPITAL_COMMUNITY)
Admission: AD | Disposition: A | Discharge status: Home / Self Care | Payer: Self-pay | Source: Ambulatory Visit | Attending: Internal Medicine

## 2014-01-06 ENCOUNTER — Inpatient Hospital Stay (HOSPITAL_COMMUNITY): Payer: Medicare Other

## 2014-01-06 ENCOUNTER — Encounter (HOSPITAL_COMMUNITY): Payer: Medicare Other | Admitting: Anesthesiology

## 2014-01-06 ENCOUNTER — Encounter (HOSPITAL_COMMUNITY): Payer: Self-pay | Admitting: Certified Registered Nurse Anesthetist

## 2014-01-06 HISTORY — PX: INTRAMEDULLARY (IM) NAIL INTERTROCHANTERIC: SHX5875

## 2014-01-06 LAB — CBC
HEMATOCRIT: 36.6 % (ref 36.0–46.0)
Hemoglobin: 12 g/dL (ref 12.0–15.0)
MCH: 28.8 pg (ref 26.0–34.0)
MCHC: 32.8 g/dL (ref 30.0–36.0)
MCV: 88 fL (ref 78.0–100.0)
Platelets: 400 10*3/uL (ref 150–400)
RBC: 4.16 MIL/uL (ref 3.87–5.11)
RDW: 14 % (ref 11.5–15.5)
WBC: 11 10*3/uL — ABNORMAL HIGH (ref 4.0–10.5)

## 2014-01-06 LAB — BASIC METABOLIC PANEL
BUN: 25 mg/dL — AB (ref 6–23)
CO2: 26 mEq/L (ref 19–32)
Calcium: 9.2 mg/dL (ref 8.4–10.5)
Chloride: 102 mEq/L (ref 96–112)
Creatinine, Ser: 0.42 mg/dL — ABNORMAL LOW (ref 0.50–1.10)
GFR calc non Af Amer: >90 mL/min (ref >90)
Glucose, Bld: 108 mg/dL — ABNORMAL HIGH (ref 70–99)
Potassium: 4.7 mEq/L (ref 3.7–5.3)
Sodium: 141 mEq/L (ref 137–147)

## 2014-01-06 LAB — PREPARE RBC (CROSSMATCH)

## 2014-01-06 SURGERY — FIXATION, FRACTURE, INTERTROCHANTERIC, WITH INTRAMEDULLARY ROD
Anesthesia: General | Laterality: Right

## 2014-01-06 MED ORDER — TRANEXAMIC ACID 100 MG/ML IV SOLN
1000.0000 mg | INTRAVENOUS | Status: DC
  Filled 2014-01-06: qty 10

## 2014-01-06 MED ORDER — MIDAZOLAM HCL 2 MG/2ML IJ SOLN
INTRAMUSCULAR | Status: AC
  Filled 2014-01-06: qty 2

## 2014-01-06 MED ORDER — SODIUM CHLORIDE 0.9 % IV SOLN
10.0000 mg | INTRAVENOUS | Status: DC | PRN
  Administered 2014-01-06: 20 ug/min via INTRAVENOUS

## 2014-01-06 MED ORDER — FENTANYL CITRATE 0.05 MG/ML IJ SOLN
25.0000 ug | INTRAMUSCULAR | Status: DC | PRN
  Administered 2014-01-06 (×2): 25 ug via INTRAVENOUS
  Administered 2014-01-06: 50 ug via INTRAVENOUS

## 2014-01-06 MED ORDER — LACTATED RINGERS IV SOLN
INTRAVENOUS | Status: DC
  Administered 2014-01-06 – 2014-01-07 (×2): via INTRAVENOUS

## 2014-01-06 MED ORDER — PROPOFOL 10 MG/ML IV BOLUS
INTRAVENOUS | Status: AC
  Filled 2014-01-06: qty 20

## 2014-01-06 MED ORDER — FENTANYL CITRATE 0.05 MG/ML IJ SOLN
INTRAMUSCULAR | Status: DC | PRN
  Administered 2014-01-06: 50 ug via INTRAVENOUS
  Administered 2014-01-06: 100 ug via INTRAVENOUS

## 2014-01-06 MED ORDER — HYDROCODONE-ACETAMINOPHEN 5-325 MG PO TABS: 1.0000 | ORAL_TABLET | Freq: Four times a day (QID) | ORAL | Status: AC | PRN

## 2014-01-06 MED ORDER — CEFAZOLIN SODIUM-DEXTROSE 2-3 GM-% IV SOLR: 2.0000 g | Freq: Four times a day (QID) | INTRAVENOUS | Status: DC

## 2014-01-06 MED ORDER — TRANEXAMIC ACID 100 MG/ML IV SOLN
2500.0000 mg | INTRAVENOUS | Status: DC | PRN
  Administered 2014-01-06: 1000 mg via INTRAVENOUS

## 2014-01-06 MED ORDER — ONDANSETRON HCL 4 MG PO TABS: 4.0000 mg | ORAL_TABLET | Freq: Four times a day (QID) | ORAL | Status: DC | PRN

## 2014-01-06 MED ORDER — BUPIVACAINE HCL (PF) 0.5 % IJ SOLN
INTRAMUSCULAR | Status: DC | PRN
  Administered 2014-01-06: 10 mL

## 2014-01-06 MED ORDER — CALCIUM CARBONATE-VITAMIN D 500-200 MG-UNIT PO TABS
1.0000 | ORAL_TABLET | Freq: Every day | ORAL | Status: DC
  Filled 2014-01-06 (×4): qty 1

## 2014-01-06 MED ORDER — POLYETHYLENE GLYCOL 3350 17 G PO PACK: 17.0000 g | PACK | Freq: Two times a day (BID) | ORAL | Status: DC

## 2014-01-06 MED ORDER — PHENYLEPHRINE HCL 10 MG/ML IJ SOLN
INTRAMUSCULAR | Status: AC
  Filled 2014-01-06: qty 1

## 2014-01-06 MED ORDER — LACTATED RINGERS IV SOLN
INTRAVENOUS | Status: DC | PRN
  Administered 2014-01-06: 13:00:00 via INTRAVENOUS

## 2014-01-06 MED ORDER — PNEUMOCOCCAL VAC POLYVALENT 25 MCG/0.5ML IJ INJ
0.5000 mL | INJECTION | INTRAMUSCULAR | Status: DC
  Filled 2014-01-06: qty 0.5

## 2014-01-06 MED ORDER — ONDANSETRON HCL 4 MG/2ML IJ SOLN
INTRAMUSCULAR | Status: DC | PRN
  Administered 2014-01-06: 4 mg via INTRAVENOUS

## 2014-01-06 MED ORDER — ONDANSETRON HCL 4 MG/2ML IJ SOLN: 4.0000 mg | Freq: Four times a day (QID) | INTRAMUSCULAR | Status: DC | PRN

## 2014-01-06 MED ORDER — ROCURONIUM BROMIDE 100 MG/10ML IV SOLN
INTRAVENOUS | Status: DC | PRN
  Administered 2014-01-06: 20 mg via INTRAVENOUS

## 2014-01-06 MED ORDER — PROPOFOL 10 MG/ML IV BOLUS
INTRAVENOUS | Status: DC | PRN
  Administered 2014-01-06: 150 mg via INTRAVENOUS
  Administered 2014-01-06: 20 mg via INTRAVENOUS

## 2014-01-06 MED ORDER — DEXTROSE 5 % IV SOLN
1.0000 g | INTRAVENOUS | Status: DC
  Filled 2014-01-06 (×2): qty 10

## 2014-01-06 MED ORDER — PHENYLEPHRINE HCL 10 MG/ML IJ SOLN
INTRAMUSCULAR | Status: DC | PRN
  Administered 2014-01-06 (×2): 80 ug via INTRAVENOUS

## 2014-01-06 MED ORDER — CALCIUM CARBONATE-VITAMIN D 600-400 MG-UNIT PO TABS: 1.0000 | ORAL_TABLET | Freq: Every day | ORAL | Status: DC

## 2014-01-06 MED ORDER — SUCCINYLCHOLINE CHLORIDE 20 MG/ML IJ SOLN
INTRAMUSCULAR | Status: DC | PRN
  Administered 2014-01-06: 100 mg via INTRAVENOUS

## 2014-01-06 MED ORDER — FENTANYL CITRATE 0.05 MG/ML IJ SOLN
INTRAMUSCULAR | Status: AC
  Administered 2014-01-06: 25 ug via INTRAVENOUS
  Filled 2014-01-06: qty 2

## 2014-01-06 MED ORDER — FENTANYL CITRATE 0.05 MG/ML IJ SOLN
INTRAMUSCULAR | Status: AC
  Filled 2014-01-06: qty 5

## 2014-01-06 MED ORDER — BUPIVACAINE HCL (PF) 0.5 % IJ SOLN
INTRAMUSCULAR | Status: AC
  Filled 2014-01-06: qty 10

## 2014-01-06 MED ORDER — ASPIRIN EC 325 MG PO TBEC: 325.0000 mg | DELAYED_RELEASE_TABLET | Freq: Every day | ORAL | Status: AC

## 2014-01-06 MED ORDER — LIDOCAINE HCL (CARDIAC) 20 MG/ML IV SOLN
INTRAVENOUS | Status: DC | PRN
  Administered 2014-01-06: 100 mg via INTRAVENOUS

## 2014-01-06 MED ORDER — SODIUM CHLORIDE 0.9 % IR SOLN
Status: DC | PRN
  Administered 2014-01-06: 1000 mL

## 2014-01-06 MED ORDER — ASPIRIN EC 325 MG PO TBEC
325.0000 mg | DELAYED_RELEASE_TABLET | Freq: Every day | ORAL | Status: DC
  Administered 2014-01-08: 325 mg via ORAL
  Filled 2014-01-06 (×3): qty 1

## 2014-01-06 SURGICAL SUPPLY — 39 items
BLADE SURG ROTATE 9660 (MISCELLANEOUS) ×3 IMPLANT
COVER SURGICAL LIGHT HANDLE (MISCELLANEOUS) ×6 IMPLANT
DECANTER SPIKE VIAL GLASS SM (MISCELLANEOUS) ×3 IMPLANT
DRAPE STERI IOBAN 125X83 (DRAPES) ×3 IMPLANT
DRSG MEPILEX BORDER 4X12 (GAUZE/BANDAGES/DRESSINGS) ×3 IMPLANT
DRSG MEPILEX BORDER 4X4 (GAUZE/BANDAGES/DRESSINGS) ×3 IMPLANT
DRSG MEPILEX BORDER 4X8 (GAUZE/BANDAGES/DRESSINGS) ×3 IMPLANT
DURAPREP 26ML APPLICATOR (WOUND CARE) ×3 IMPLANT
ELECT CAUTERY BLADE 6.4 (BLADE) ×3 IMPLANT
ELECT REM PT RETURN 9FT ADLT (ELECTROSURGICAL) ×3
ELECTRODE REM PT RTRN 9FT ADLT (ELECTROSURGICAL) ×1 IMPLANT
EVACUATOR 1/8 PVC DRAIN (DRAIN) IMPLANT
GAUZE XEROFORM 5X9 LF (GAUZE/BANDAGES/DRESSINGS) ×3 IMPLANT
GLOVE BIOGEL PI IND STRL 8 (GLOVE) ×2 IMPLANT
GLOVE BIOGEL PI INDICATOR 8 (GLOVE) ×4
GLOVE ECLIPSE 7.5 STRL STRAW (GLOVE) ×6 IMPLANT
GOWN PREVENTION PLUS XLARGE (GOWN DISPOSABLE) ×3 IMPLANT
GOWN SRG XL XLNG 56XLVL 4 (GOWN DISPOSABLE) ×1 IMPLANT
GOWN STRL NON-REIN LRG LVL3 (GOWN DISPOSABLE) ×3 IMPLANT
GOWN STRL NON-REIN XL XLG LVL4 (GOWN DISPOSABLE) ×3
KIT BASIN OR (CUSTOM PROCEDURE TRAY) ×3 IMPLANT
KIT ROOM TURNOVER OR (KITS) ×3 IMPLANT
MANIFOLD NEPTUNE II (INSTRUMENTS) ×3 IMPLANT
NS IRRIG 1000ML POUR BTL (IV SOLUTION) ×3 IMPLANT
PACK GENERAL/GYN (CUSTOM PROCEDURE TRAY) ×3 IMPLANT
PAD ARMBOARD 7.5X6 YLW CONV (MISCELLANEOUS) ×6 IMPLANT
PLATE 150D 5H STD (Plate) ×3 IMPLANT
SCREW ACECAP 30MM (Screw) ×6 IMPLANT
SCREW ACECAP 32MM (Screw) ×3 IMPLANT
SCREW ACECAP 34MM (Screw) ×6 IMPLANT
SCREW LAG 70MM (Screw) ×3 IMPLANT
STAPLER VISISTAT 35W (STAPLE) ×3 IMPLANT
SUT VIC AB 0 CTB1 27 (SUTURE) ×6 IMPLANT
SUT VIC AB 1 CTB1 27 (SUTURE) ×3 IMPLANT
SUT VIC AB 2-0 CTB1 (SUTURE) ×3 IMPLANT
TOWEL OR 17X24 6PK STRL BLUE (TOWEL DISPOSABLE) ×3 IMPLANT
TOWEL OR 17X26 10 PK STRL BLUE (TOWEL DISPOSABLE) ×3 IMPLANT
TRAY FOLEY CATH 14FR (SET/KITS/TRAYS/PACK) ×3 IMPLANT
WATER STERILE IRR 1000ML POUR (IV SOLUTION) ×3 IMPLANT

## 2014-01-06 NOTE — Progress Notes (Signed)
Subjective: Pt not able to give good history.  Seems painful to movement   Objective: Vital signs in last 24 hours: Temp:  [98.2 F (36.8 C)-98.7 F (37.1 C)] 98.7 F (37.1 C) (03/04 0612) Pulse Rate:  [84-90] 84 (03/04 0612) Resp:  [18] 18 (03/04 0800) BP: (142-148)/(62-70) 142/62 mmHg (03/04 0612) SpO2:  [98 %-99 %] 98 % (03/04 0800)  Intake/Output from previous day: 03/03 0701 - 03/04 0700 In: 600 [P.O.:600] Out: -  Intake/Output this shift:     Recent Labs  01/04/14 1455 01/06/14 0445  HGB 12.4 12.0    Recent Labs  01/04/14 1455 01/06/14 0445  WBC 13.3* 11.0*  RBC 4.24 4.16  HCT 37.3 36.6  PLT 372 400    Recent Labs  01/04/14 1455 01/06/14 0445  NA 145 141  K 4.2 4.7  CL 105 102  CO2 28 26  BUN 38* 25*  CREATININE 0.53 0.42*  GLUCOSE 128* 108*  CALCIUM 9.3 9.2   No results found for this basename: LABPT, INR,  in the last 72 hours  ABD soft Intact pulses distally No cellulitis present Compartment soft Ct: acute fx r hip Assessment/Plan: 73 yo female in distress with fx r hip// plan orif hip with screw and side plate because of high neck shaft angle   Nevan Creighton L 01/06/2014, 1:20 PM

## 2014-01-06 NOTE — Brief Op Note (Signed)
01/04/2014 - 01/06/2014  3:30 PM  PATIENT:  Pamela Richard  73 y.o. female  PRE-OPERATIVE DIAGNOSIS:  FRACTURED RIGHT INTERTROCHANTERIC  POST-OPERATIVE DIAGNOSIS:  FRACTURED RIGHT INTERTROCHANTERIC  PROCEDURE:  Procedure(s): Compression hip screw (Right)  SURGEON:  Surgeon(s) and Role:    * Harvie JuniorJohn L Donnie Gedeon, MD - Primary  PHYSICIAN ASSISTANT:   ASSISTANTS: bethune   ANESTHESIA:   general  EBL:     BLOOD ADMINISTERED:none  DRAINS: none   LOCAL MEDICATIONS USED:  MARCAINE     SPECIMEN:  No Specimen  DISPOSITION OF SPECIMEN:  N/A  COUNTS:  YES  TOURNIQUET:  * No tourniquets in log *  DICTATION: .Other Dictation: Dictation Number [id number]  PLAN OF CARE: Admit to inpatient   PATIENT DISPOSITION:  PACU - hemodynamically stable.   Delay start of Pharmacological VTE agent (>24hrs) due to surgical blood loss or risk of bleeding: no

## 2014-01-06 NOTE — Transfer of Care (Signed)
Immediate Anesthesia Transfer of Care Note  Patient: Pamela FailCarol A Tata  Procedure(s) Performed: Procedure(s): Compression hip screw (Right)  Patient Location: PACU  Anesthesia Type:General  Level of Consciousness: awake, alert  and oriented  Airway & Oxygen Therapy: Patient Spontanous Breathing and Patient connected to nasal cannula oxygen  Post-op Assessment: Report given to PACU RN, Post -op Vital signs reviewed and stable and Patient moving all extremities  Post vital signs: Reviewed and stable  Complications: No apparent anesthesia complications

## 2014-01-06 NOTE — Progress Notes (Signed)
[facility] PROGRESS NOTE  Pamela Richard ZOX:[id number] DOB: Dec 23, 1940 DOA: 01/04/2014 PCP: No primary provider on file.  Assessment/Plan: #1 right hip intertrochanteric fracture. Status post ORIF today, per orthopedics>> appreciate the assistance  #2 Escherichia coli urinary tract infection Urine cultures with Escherichia coli. Continue Rocephin. -Follow sensitivities and further manage accordingly #3 leukocytosis Likely secondary to problem #2. Chest x-ray is negative for any acute infiltrates. Urine cultures are pending. Follow.  #4 Cerebral Palsy/Spastic quadriplegia Stable.  #5 Seizure   #6 Chronic constipation Laxative    Code Status: full Family Communication: no family present. Disposition Plan: SNF when medically stable.   Consultants:  Orthopedics: Dr. Luiz Blare 01/04/2014  Procedures:  Chest x-ray 01/05/2014  Antibiotics:  Rocephin 01/05/2013  HPI/Subjective: Patient noncommunicative. Status post surgery, no new complaints reported per nursing.  Objective: Filed Vitals:   01/06/14 1636  BP: 167/97  Pulse: 85  Temp: 98 F (36.7 C)  Resp: 14    Intake/Output Summary (Last 24 hours) at 01/06/14 1857 Last data filed at 01/06/14 1545  Gross per 24 hour  Intake   1000 ml  Output    200 ml  Net    800 ml   Filed Weights   01/05/14 1100  Weight: 50.803 kg (112 lb)    Exam:   General:  NAD  Cardiovascular: RRR  Respiratory: CTAB  Abdomen: soft, nontender, nondistended, positive bowel sounds.  Musculoskeletal: no clubbing cyanosis or edema  Data Reviewed: Basic Metabolic Panel:  Recent Labs Lab 01/04/14 1455 01/06/14 0445  NA 145 141  K 4.2 4.7  CL 105 102  CO2 28 26  GLUCOSE 128* 108*  BUN 38* 25*  CREATININE 0.53 0.42*  CALCIUM 9.3 9.2   Liver Function Tests: No results found for this basename: AST, ALT, ALKPHOS, BILITOT, PROT, ALBUMIN,  in the last 168 hours No results found for this basename: LIPASE, AMYLASE,   in the last 168 hours No results found for this basename: AMMONIA,  in the last 168 hours CBC:  Recent Labs Lab 01/04/14 1455 01/06/14 0445  WBC 13.3* 11.0*  HGB 12.4 12.0  HCT 37.3 36.6  MCV 88.0 88.0  PLT 372 400   Cardiac Enzymes: No results found for this basename: CKTOTAL, CKMB, CKMBINDEX, TROPONINI,  in the last 168 hours BNP (last 3 results) No results found for this basename: PROBNP,  in the last 8760 hours CBG: No results found for this basename: GLUCAP,  in the last 168 hours  Recent Results (from the past 240 hour(s))  URINE CULTURE     Status: None   Collection Time    01/05/14  6:07 AM      Result Value Ref Range Status   Specimen Description URINE, RANDOM   Final   Special Requests NONE   Final   Culture  Setup Time     Final   Value: 01/05/2014 12:47     Performed at [facility] Count     Final   Value: >=100,000 COLONIES/ML     Performed at [facility]   Culture     Final   Value: ESCHERICHIA COLI     Performed at [facility]   Report Status PENDING   Incomplete  SURGICAL PCR SCREEN     Status: None   Collection Time    01/05/14  4:53 PM      Result Value Ref Range Status   MRSA, PCR NEGATIVE  NEGATIVE Final   Staphylococcus  aureus NEGATIVE  NEGATIVE Final   Comment:            The Xpert SA Assay (FDA     approved for NASAL specimens     in patients over 72 years of age),     is one component of     a comprehensive surveillance     program.  Test performance has     been validated by [facility] for patients greater     than or equal to 72 year old.     It is not intended     to diagnose infection nor to     guide or monitor treatment.     Studies: Dg Hip Operative Right  01/06/2014   CLINICAL DATA:  ORIF right hip  EXAM: DG OPERATIVE RIGHT HIP  TECHNIQUE: A single spot fluoroscopic AP image of the right hip is submitted.  COMPARISON:  None.  FINDINGS: Single fluoroscopic spot image provided  demonstrating interval ORIF of a femoral neck fracture in near anatomic alignment.  IMPRESSION: ORIF right hip.   Electronically Signed   By: Elige KoHetal  Patel   On: 01/06/2014 16:19   Ct Hip Right Wo Contrast  01/06/2014   CLINICAL DATA:  Hip fracture.  EXAM: CT OF THE RIGHT HIP WITHOUT CONTRAST  TECHNIQUE: Multidetector CT imaging was performed according to the standard protocol. Multiplanar CT image reconstructions were also generated.  COMPARISON:  DG PELVIS 1-2 VIEWS dated 11/27/2010; DG HIP BILATERAL W/PELVIS dated 12/28/2009  FINDINGS: Intertrochanteric fracture of the right proximal femur observed with 1.2 cm of anterior displacement of the shaft fragment with suspected the femoral head fragment. Moderate comminution along the fracture site.  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 Mild hematoma noted along the gluteal musculature adjacent to the fracture.  IMPRESSION: 1. Intertrochanteric fracture, right proximal femur with moderate comminution. 1.2 cm anterior displacement of the shaft fragment with respect to the femoral head fragment. 2. Dysplastic right acetabulum with flattening and irregularity of the right femoral head.   Electronically Signed   By: Herbie BaltimoreWalt  Liebkemann M.D.   On: 01/06/2014 09:13   Dg Chest Port 1 View  01/05/2014   CLINICAL DATA:  Fever  EXAM: PORTABLE CHEST - 1 VIEW  COMPARISON:  DG CHEST 1 VIEW dated 11/27/2010  FINDINGS: Focal opacity at the retrocardiac left base is nonspecific and unchanged. Lungs otherwise clear. No pneumothorax. Normal heart size. Normal vascularity. Interstitial and bronchitic changes have a chronic appearance.  IMPRESSION: Focal opacity at the left base. Airspace disease is not excluded. It was seen previously and may represent a chronic finding. Two view upright study may be helpful.   Electronically Signed   By: Maryclare BeanArt  Hoss M.D.   On: 01/05/2014 08:11    Scheduled Meds: . [START ON 01/07/2014]  aspirin EC  325 mg Oral Q breakfast  . calcitonin (salmon)  1 spray Alternating Nares QODAY  . calcium-vitamin D  1 tablet Oral BID WC  . calcium-vitamin D  1 tablet Oral Q breakfast  . [START ON 01/07/2014] cefTRIAXone (ROCEPHIN)  IV  1 g Intravenous Q24H  . feeding supplement (ENSURE COMPLETE)  237 mL Oral BID BM  . magnesium hydroxide  15 mL Oral Daily  . [START ON 01/07/2014] pneumococcal 23 valent vaccine  0.5 mL Intramuscular Tomorrow-1000  . polyethylene glycol  17 g Oral BID   Continuous Infusions: . lactated ringers 50 mL/hr at 01/06/14  1308    Principal Problem:   Closed right hip fracture Active Problems:   History of seizure   Cerebral palsy   Spastic quadriplegia   Chronic constipation   UTI (urinary tract infection)    Time spent: 25 mins    Kela Millin MD [facility] Pager [phone]. If 7PM-7AM, please contact night-coverage at [email or web address], password [facility] 01/06/2014, 6:57 PM  LOS: 2 days

## 2014-01-06 NOTE — Progress Notes (Signed)
Paged Dannielle BurnEric Phillips PA regarding morning dose of heparin with patient being scheduled for surgery this afternoon. Was instructed to administer dose.

## 2014-01-06 NOTE — Preoperative (Signed)
Beta Blockers   Reason not to administer Beta Blockers:Not Applicable 

## 2014-01-06 NOTE — Anesthesia Preprocedure Evaluation (Addendum)
Anesthesia Evaluation  Patient identified by MRN, date of birth, ID band Patient awake  General Assessment Comment:Unable to obtain history from patient. History obtained from brother and chart. CE  Reviewed: Allergy & Precautions, H&P , NPO status , Patient's Chart, lab work & pertinent test results  Airway Mallampati: II      Dental   Pulmonary          Cardiovascular negative cardio ROS      Neuro/Psych    GI/Hepatic negative GI ROS, Neg liver ROS,   Endo/Other  negative endocrine ROS  Renal/GU negative Renal ROS     Musculoskeletal   Abdominal   Peds  Hematology   Anesthesia Other Findings   Reproductive/Obstetrics                          Anesthesia Physical Anesthesia Plan  ASA: III  Anesthesia Plan: General   Post-op Pain Management:    Induction: Intravenous  Airway Management Planned: Oral ETT  Additional Equipment:   Intra-op Plan:   Post-operative Plan: Possible Post-op intubation/ventilation  Informed Consent: I have reviewed the patients History and Physical, chart, labs and discussed the procedure including the risks, benefits and alternatives for the proposed anesthesia with the patient or authorized representative who has indicated his/her understanding and acceptance.   Dental advisory given  Plan Discussed with:   Anesthesia Plan Comments:         Anesthesia Quick Evaluation

## 2014-01-06 NOTE — Anesthesia Postprocedure Evaluation (Signed)
Anesthesia Post Note  Patient: Domenica FailCarol A Vanderschaaf  Procedure(s) Performed: Procedure(s) (LRB): Compression hip screw (Right)  Anesthesia type: general  Patient location: PACU  Post pain: Pain level controlled  Post assessment: Patient's Cardiovascular Status Stable  Post vital signs: Reviewed and stable  Level of consciousness: sedated  Complications: No apparent anesthesia complications

## 2014-01-07 ENCOUNTER — Encounter (HOSPITAL_COMMUNITY): Payer: Self-pay | Admitting: Orthopedic Surgery

## 2014-01-07 LAB — BASIC METABOLIC PANEL
BUN: 16 mg/dL (ref 6–23)
CO2: 25 mEq/L (ref 19–32)
Calcium: 9 mg/dL (ref 8.4–10.5)
Chloride: 99 mEq/L (ref 96–112)
Creatinine, Ser: 0.45 mg/dL — ABNORMAL LOW (ref 0.50–1.10)
GFR calc Af Amer: >90 mL/min (ref >90)
Glucose, Bld: 123 mg/dL — ABNORMAL HIGH (ref 70–99)
Potassium: 4.4 mEq/L (ref 3.7–5.3)
Sodium: 138 mEq/L (ref 137–147)

## 2014-01-07 LAB — CBC
HCT: 32.4 % — ABNORMAL LOW (ref 36.0–46.0)
Hemoglobin: 10.8 g/dL — ABNORMAL LOW (ref 12.0–15.0)
MCH: 29 pg (ref 26.0–34.0)
MCHC: 33.3 g/dL (ref 30.0–36.0)
MCV: 86.9 fL (ref 78.0–100.0)
Platelets: 440 10*3/uL — ABNORMAL HIGH (ref 150–400)
RBC: 3.73 MIL/uL — ABNORMAL LOW (ref 3.87–5.11)
RDW: 14 % (ref 11.5–15.5)
WBC: 10.4 10*3/uL (ref 4.0–10.5)

## 2014-01-07 LAB — URINE CULTURE: Colony Count: >=100000

## 2014-01-07 MED ORDER — BISACODYL 10 MG RE SUPP: 10.0000 mg | Freq: Every day | RECTAL | Status: AC | PRN

## 2014-01-07 MED ORDER — CEFUROXIME AXETIL 500 MG PO TABS: 500.0000 mg | ORAL_TABLET | Freq: Two times a day (BID) | ORAL | Status: AC

## 2014-01-07 MED ORDER — ENSURE COMPLETE PO LIQD: 237.0000 mL | Freq: Two times a day (BID) | ORAL | Status: AC

## 2014-01-07 NOTE — Care Management Note (Signed)
CARE MANAGEMENT NOTE 01/07/2014  Patient:  Pamela Richard,Pamela Richard   Account Number:  [id number]  Date Initiated:  01/07/2014  Documentation initiated by:  Vance PeperBRADY,Solimar Maiden  Subjective/Objective Assessment:   73 yr old female s/p right intrachanteric ORIF     Action/Plan:   [facility] [address] [place]   Anticipated DC Date:  01/07/2014   Anticipated DC Plan:  GROUP HOME  In-house referral  Clinical Social Worker      DC Planning Services  CM consult      [facility]  DURABLE MEDICAL EQUIPMENT   Choice offered to / List presented to:     DME arranged  HOSPITAL BED      DME agency  [facility]     [facility]  NA      Status of service:  Completed, signed off Medicare Important Message given?   (If response is "NO", the following Medicare IM given date fields will be blank) Date Medicare IM given:   Date Additional Medicare IM given:    Discharge Disposition:  GROUP HOME

## 2014-01-07 NOTE — Op Note (Signed)
NAMArnold Long:  Cutrone, Ricki               ACCOUNT NO.:  [id number]  MEDICAL RECORD NO.:  [id number]  LOCATION:  [id number]                        FACILITY:  [facility]  PHYSICIAN:  Harvie JuniorJohn L. Jeet Shough, M.D.   DATE OF BIRTH:  [birth year]  DATE OF PROCEDURE:  01/06/2014 DATE OF DISCHARGE:                              OPERATIVE REPORT   PREOPERATIVE DIAGNOSIS:  Displaced intertrochanteric hip fracture, right.  POSTOPERATIVE DIAGNOSIS:  Displaced intertrochanteric hip fracture, right.  PROCEDURE:  Open reduction and internal fixation of right intertrochanteric hip fracture with essentially a medial displacement osteotomy.  SURGEON:  Harvie JuniorJohn L. Luanna Weesner, M.D.  ASSISTANT:  Marshia LyJames Bethune, P.A.  ANESTHESIA:  General.  BRIEF HISTORY:  Ms. Tyler DeisWheeler is a 73 year old female with a long history of having had significant complaints of right hip pain.  She is basically noncommunicative and she was brought to my office in a wheelchair having been complaining of some or at least the staff thinking that she was having complaints of right hip pain.  X-rays were taken portably which showed a question of an intertrochanteric fracture, and they brought her over.  We got better x-rays in the office which clearly showed at least in my opinion an intertrochanteric hip fracture. The patient not able to communicate, although it did appear as when we tried to move her that she was having pain in the hip, although it was obviously very difficult to tell.  I had a long discussion with her brother who is her caregiver who was out of town.  We felt that it is a very difficult problem in a tough situation but we felt that taking her to the operating room was probably the right answer to try to keep this pieces of bone from moving around so that she could have less pain with activity and so after a long discussion, and agonizing over what would be the appropriate course of action, she was taken to the operating room for a compression  hip screw.  She had a dramatic valgus inclination to her femoral neck and this is related probably to her long cerebral palsy history, but it did not feel like an intramedullary rod could be done with the valgus inclination that she had and so we elected to use a dynamic hip compression screw.  She was brought to the operating room for this procedure.  PROCEDURE:  She was taken to the operating room for compression hip screw and procedure.  The patient was taken to the operating room after adequate anesthesia was obtained with the general anesthetic, the patient was placed supine on the operating table and moved on to the fracture table and carefully had her well leg placed in the well leg holder.  Attention then turned to the right hip.  After routine prep and drape, an incision was made in the subcutaneous tissue down to the level of the tensor fascia which was divided in line with its fibers, and then the vastus lateralis was identified and retracted anteriorly.  An incision was made in the vastus fibers.  The vastus muscle was dissected off of the vastus lateralis fascia and back to the femur and then bent retractors put in place.  At this point, the fracture was identified. The guidewires were placed directly in the center of the head on both AP and lateral fluoro over measured to a 70 over-reamed and then the guide. The screw was advanced, we used a super thread screw just because of her severe osteoporosis.  She was in a medial displaced position.  We felt like that was going to be the best opportunity for her to get healing and so we left her in this position.  We took a 5-hole plate attached with standard AO fashion.  Final fluoro images were taken showing excellent alignment of the screw threads and the screws, and the wound was irrigated, suctioned dry.  We closed the vastus with 0 Vicryl, tensor fascia with 1 Vicryl, skin with 0 and 2-0 Vicryl, and skin staples.  Sterile  compressive dressing was applied and the patient was taken to recovery room, she was noted to be in satisfactory condition. Estimated blood loss for the procedure was about 250 mL but the final amount can be gotten from anesthetic record.  The patient was then taken to recovery room where she noted to be in satisfactory condition. Estimated blood loss for the procedure was as outlined.     Harvie Junior, M.D.     Ranae Plumber  D:  01/06/2014  T:  01/07/2014  Job:  [id number]

## 2014-01-07 NOTE — Progress Notes (Signed)
Subjective: 1 Day Post-Op Procedure(s) (LRB): Compression hip screw (Right) Patient reports pain as 2 on 0-10 scale.  No obvious signs of pain.  Pt minimally communacative.   Objective: Vital signs in last 24 hours: Temp:  [97.5 F (36.4 C)-98 F (36.7 C)] 97.8 F (36.6 C) (03/05 0603) Pulse Rate:  [74-103] 103 (03/05 0603) Resp:  [14-18] 18 (03/05 0603) BP: (134-183)/(59-97) 134/93 mmHg (03/05 0603) SpO2:  [96 %-100 %] 97 % (03/05 0603)  Intake/Output from previous day: 03/04 0701 - 03/05 0700 In: 1000 [I.V.:900; IV Piggyback:100] Out: 200 [Urine:200] Intake/Output this shift:     Recent Labs  01/04/14 1455 01/06/14 0445 01/07/14 0515  HGB 12.4 12.0 10.8*    Recent Labs  01/06/14 0445 01/07/14 0515  WBC 11.0* 10.4  RBC 4.16 3.73*  HCT 36.6 32.4*  PLT 400 440*    Recent Labs  01/06/14 0445 01/07/14 0515  NA 141 138  K 4.7 4.4  CL 102 99  CO2 26 25  BUN 25* 16  CREATININE 0.42* 0.45*  GLUCOSE 108* 123*  CALCIUM 9.2 9.0   No results found for this basename: LABPT, INR,  in the last 72 hours Right hip exam: Neurovascular intact Sensation intact distally Intact pulses distally Dorsiflexion/Plantar flexion intact Incision: dressing C/D/I Compartment soft  Assessment/Plan: 1 Day Post-Op Procedure(s) (LRB): Compression hip screw (Right) Plan: Can be discharged anytime to appropriate situation. TDWB on right   Although a little more is ok ASA 325 mg x 1 month for VTE prophylaxis rx for pain meds in chart F/U Dr Luiz BlareGraves in 2 weeks  Kazandra Forstrom G 01/07/2014, 8:11 AM

## 2014-01-07 NOTE — Progress Notes (Signed)
Patient refused crushed medications this morning.  Pursing her lips and shaking her head no.  Will attempt administration again.  Brother at bedside and made aware.

## 2014-01-07 NOTE — Discharge Summary (Signed)
Physician Discharge Summary  Pamela Richard ZOX:[id number] DOB: May 02, 1941 DOA: 01/04/2014  PCP: No primary provider on file.  Admit date: 01/04/2014 Discharge date: 01/07/2014  Time spent: >30 minutes  Recommendations for Outpatient Follow-up:  Follow-up Information   Follow up with GRAVES,JOHN L, MD. Schedule an appointment as soon as possible for a visit in 2 weeks.   Specialty:  Orthopedic Surgery   Contact information:   [address] [place] [address] [phone]       Please follow up. (PCP in 1week, call for appt upon discharge)       Discharge Diagnoses:  Principal Problem:   Closed right hip fracture Active Problems:   History of seizure   Cerebral palsy   Spastic quadriplegia   Chronic constipation   UTI (urinary tract infection)   Discharge Condition: Improved/stable  Diet recommendation: Regular  Filed Weights   01/05/14 1100  Weight: 50.803 kg (112 lb)    History of present illness:  Pamela Richard is a 73 y.o. female with history of Severe mental retardation, cerebral palsy, bed and chair bound directly admitted from Dr. Luiz Blare office with right intertrochanteric fracture. Pt does not communicate. All history is per caregiver and outpatient chart. She lives in a group home and last week was noted by be crying frequently. Eventually, a right hip fracture was found. No known fall or trauma. Surgery was scheduled for Wednesday 3/4. She was admitted for further evaluation and management.   Hospital Course:  #1 right hip intertrochanteric fracture.  As discussed above patient was admitted from Dr. Darene Lamer office with a right intratrochanteric fracture, Dr. Luiz Blare saw patient in the hospital and she was taken to surgery on 3/4 for ORIF. On followup today per orthopedics okay to discharge back to group home-TDWB on right, she is to continue aspirin 325 mg for one month for post hip DVT prophylaxis, oral narcotics as needed for pain management. She is to  followup with Dr. Luiz Blare in 2 weeks #2 Escherichia coli urinary tract infection  Urine cultures grew Escherichia coli in the hospital and patient was started on Rocephin. She'll be discharged on Ceftin to complete the antibiotic regimen. She has remained afebrile and her leukocytosis resolved. #3 leukocytosis  Likely secondary to problem #2. Chest x-ray is negative for any acute infiltrates.   #4 Cerebral Palsy/Spastic quadriplegia  Stable.  #5 history of Seizure - On no antiepileptics as per admission med Rec and has had no seizures in the hospital. #6 Chronic constipation  Continue bowel regimen upon discharge   Procedures:  Status post ORIF of right intertrochanteric hip fracture with essentially medial displacement osteotomy.  Consultations:  Orthopedic/Dr. Luiz Blare  Discharge Exam: Filed Vitals:   01/07/14 1300  BP: 141/67  Pulse: 93  Temp: 98.2 F (36.8 C)  Resp: 18   Exam:  General: NAD  Cardiovascular: RRR  Respiratory: CTAB  Abdomen: soft, nontender, nondistended, positive bowel sounds.  Musculoskeletal: no clubbing cyanosis or edema    Discharge Instructions  Discharge Orders   Future Appointments Provider Department Dept Phone   01/22/2014 1:30 PM [facility]  IMAGING [phone]   Please wear two piece clothing and wear no powder or deodorant. Please arrive 15 minutes early prior to your appointment time.   Future Orders Complete By Expires   Diet general  As directed    Increase activity slowly  As directed    Touch down weight bearing  As directed    Questions:  Laterality:  right   Extremity:         Medication List         aspirin EC 325 MG tablet  Take 1 tablet (325 mg total) by mouth daily. Give after a meal.     bisacodyl 10 MG suppository  Commonly known as:  DULCOLAX  Place 1 suppository (10 mg total) rectally daily as needed for moderate constipation.     calcitonin (salmon) 200 UNIT/ACT nasal spray   Commonly known as:  MIACALCIN/FORTICAL  Place 1 spray into the nose every other day.     CALCIUM 600+D 600-400 MG-UNIT per tablet  Generic drug:  Calcium Carbonate-Vitamin D  Take 1 tablet by mouth daily.     cefUROXime 500 MG tablet  Commonly known as:  CEFTIN  Take 1 tablet (500 mg total) by mouth 2 (two) times daily with a meal.     feeding supplement (ENSURE COMPLETE) Liqd  Take 237 mLs by mouth 2 (two) times daily between meals.     HYDROcodone-acetaminophen 5-325 MG per tablet  Commonly known as:  NORCO  Take 1 tablet by mouth every 6 (six) hours as needed for moderate pain.     magnesium hydroxide 400 MG/5ML suspension  Commonly known as:  MILK OF MAGNESIA  Take 15 mLs by mouth daily.     polyethylene glycol packet  Commonly known as:  MIRALAX / GLYCOLAX  Take 17 g by mouth 2 (two) times daily.       Allergies  Allergen Reactions  . Contrast Media [Iodinated Diagnostic Agents] Other (See Comments)    unknown  . Iodine Other (See Comments)    unknown       Follow-up Information   Follow up with GRAVES,JOHN L, MD. Schedule an appointment as soon as possible for a visit in 2 weeks.   Specialty:  Orthopedic Surgery   Contact information:   [address] [place] [address] [phone]       Please follow up. (PCP in 1week, call for appt upon discharge)        The results of significant diagnostics from this hospitalization (including imaging, microbiology, ancillary and laboratory) are listed below for reference.    Significant Diagnostic Studies: Dg Hip Operative Right  02-05-2014   CLINICAL DATA:  ORIF right hip  EXAM: DG OPERATIVE RIGHT HIP  TECHNIQUE: A single spot fluoroscopic AP image of the right hip is submitted.  COMPARISON:  None.  FINDINGS: Single fluoroscopic spot image provided demonstrating interval ORIF of a femoral neck fracture in near anatomic alignment.  IMPRESSION: ORIF right hip.   Electronically Signed   By: Elige Ko   On:  02-05-2014 16:19   Ct Hip Right Wo Contrast  2014-02-05   CLINICAL DATA:  Hip fracture.  EXAM: CT OF THE RIGHT HIP WITHOUT CONTRAST  TECHNIQUE: Multidetector CT imaging was performed according to the standard protocol. Multiplanar CT image reconstructions were also generated.  COMPARISON:  DG PELVIS 1-2 VIEWS dated 11/27/2010; DG HIP BILATERAL W/PELVIS dated 12/28/2009  FINDINGS: Intertrochanteric fracture of the right proximal femur observed with 1.2 cm of anterior displacement of the shaft fragment with suspected the femoral head fragment. Moderate comminution along the fracture site.  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 Mild hematoma noted along the gluteal musculature adjacent to the fracture.  IMPRESSION: 1. Intertrochanteric fracture, right proximal femur with moderate comminution. 1.2 cm anterior displacement of the shaft fragment with respect  to the femoral head fragment. 2. Dysplastic right acetabulum with flattening and irregularity of the right femoral head.   Electronically Signed   By: Herbie BaltimoreWalt  Liebkemann M.D.   On: 01/06/2014 09:13   Dg Chest Port 1 View  01/05/2014   CLINICAL DATA:  Fever  EXAM: PORTABLE CHEST - 1 VIEW  COMPARISON:  DG CHEST 1 VIEW dated 11/27/2010  FINDINGS: Focal opacity at the retrocardiac left base is nonspecific and unchanged. Lungs otherwise clear. No pneumothorax. Normal heart size. Normal vascularity. Interstitial and bronchitic changes have a chronic appearance.  IMPRESSION: Focal opacity at the left base. Airspace disease is not excluded. It was seen previously and may represent a chronic finding. Two view upright study may be helpful.   Electronically Signed   By: Maryclare BeanArt  Hoss M.D.   On: 01/05/2014 08:11    Microbiology: Recent Results (from the past 240 hour(s))  URINE CULTURE     Status: None   Collection Time    01/05/14  6:07 AM      Result Value Ref Range Status   Specimen Description URINE,  RANDOM   Final   Special Requests NONE   Final   Culture  Setup Time     Final   Value: 01/05/2014 12:47     Performed at [facility]   Colony Count     Final   Value: >=100,000 COLONIES/ML     Performed at [facility]   Culture     Final   Value: ESCHERICHIA COLI     Performed at [facility]   Report Status 01/07/2014 FINAL   Final   Organism ID, Bacteria ESCHERICHIA COLI   Final  SURGICAL PCR SCREEN     Status: None   Collection Time    01/05/14  4:53 PM      Result Value Ref Range Status   MRSA, PCR NEGATIVE  NEGATIVE Final   Staphylococcus aureus NEGATIVE  NEGATIVE Final   Comment:            The Xpert SA Assay (FDA     approved for NASAL specimens     in patients over 72 years of age),     is one component of     a comprehensive surveillance     program.  Test performance has     been validated by [facility] for patients greater     than or equal to 72 year old.     It is not intended     to diagnose infection nor to     guide or monitor treatment.     Labs: Basic Metabolic Panel:  Recent Labs Lab 01/04/14 1455 01/06/14 0445 01/07/14 0515  NA 145 141 138  K 4.2 4.7 4.4  CL 105 102 99  CO2 28 26 25   GLUCOSE 128* 108* 123*  BUN 38* 25* 16  CREATININE 0.53 0.42* 0.45*  CALCIUM 9.3 9.2 9.0   Liver Function Tests: No results found for this basename: AST, ALT, ALKPHOS, BILITOT, PROT, ALBUMIN,  in the last 168 hours No results found for this basename: LIPASE, AMYLASE,  in the last 168 hours No results found for this basename: AMMONIA,  in the last 168 hours CBC:  Recent Labs Lab 01/04/14 1455 01/06/14 0445 01/07/14 0515  WBC 13.3* 11.0* 10.4  HGB 12.4 12.0 10.8*  HCT 37.3 36.6 32.4*  MCV 88.0 88.0 86.9  PLT 372 400 440*   Cardiac Enzymes: No  results found for this basename: CKTOTAL, CKMB, CKMBINDEX, TROPONINI,  in the last 168 hours BNP: BNP (last 3 results) No results found for this basename: PROBNP,  in the last  8760 hours CBG: No results found for this basename: GLUCAP,  in the last 168 hours     Signed:  Crystal Ellwood C  [facility] 01/07/2014, 2:40 PM

## 2014-01-07 NOTE — Evaluation (Signed)
Physical Therapy Evaluation Patient Details Name: Pamela Richard MRN: [id number] DOB: 05/11/1941 Today's Date: 01/07/2014 Time: 5188-4166 PT Time Calculation (min): 17 min  PT Assessment / Plan / Recommendation History of Present Illness  Pt. was admitted with R displaced IT hip fx., underwent ORIF.   Pt. also positive for e.coli UTI.   Pt  has Cp, MR, is largely noncommunicative and non ambulatory.  Is from a group home where she receives total care.  Clinical Impression  Pt. Presents to PT with dependencies in her functional mobility at baseline due to her CP, now s/p ORIF R hip.   I do not believe that PT will be of much benefit to improve her functional status.  She needs frequent turning and would benefit from an air mattress overlay to prevent skin breakdown as she natually seems to gravitate to her left side.  I will sign off as we anticiate Dc tonight or tomorrow back to her group home.  SW states pt. Is in a home that has RN care.       PT Assessment  All further PT needs can be met in the next venue of care    Follow Up Recommendations  No PT follow up;Supervision/Assistance - 24 hour;Supervision for mobility/OOB    Does the patient have the potential to tolerate intense rehabilitation      Barriers to Discharge        Equipment Recommendations  Hospital bed    Recommendations for Other Services     Frequency      Precautions / Restrictions Precautions Precautions: Fall;Other (comment) (skin precautions due to immobility and natural positioning) Precaution Comments: ; this appears to be her position of comfort natrually and she is reluctant to be positioned elsewhere Restrictions Weight Bearing Restrictions: Yes RLE Weight Bearing: Touchdown weight bearing   Pertinent Vitals/Pain Pt. Appears in pain with mobility but settles back to being comfortable when not being moved.  See vitals tab       Mobility  Bed Mobility Overal bed mobility: +2 for physical  assistance General bed mobility comments: pt. needed 2 total assist to roll for peri hygiene and was unable to assist at all Transfers Overall transfer level:  (not able to test; pt. didn't tolerate) Ambulation/Gait Ambulation/Gait assistance:  (pt. unable)    Exercises     PT Diagnosis: Other (comment) (spastic quadriplegia)  PT Problem List: Decreased strength;Decreased range of motion;Decreased activity tolerance;Decreased balance;Decreased mobility;Decreased knowledge of use of DME;Decreased safety awareness;Decreased knowledge of precautions;Pain PT Treatment Interventions:       PT Goals(Current goals can be found in the care plan section) Acute Rehab PT Goals Patient Stated Goal: pt. cannot provide goal  Visit Information  Last PT Received On: 01/07/14 Assistance Needed: +2 History of Present Illness: Pt. was admitted with R displaced IT hip fx., underwent ORIF.   Pt. also positive for e.coli UTI.   Pt  has Cp, MR, is largely noncommunicative and non ambulatory.  Is from a group home where she receives total care.       Prior [place] expects to be discharged to:: Group home Prior Function Level of Independence: Needs assistance Gait / Transfers Assistance Needed: dependent at a transfers level PTA Communication / Swallowing Assistance Needed: Pt. held my hand (appeared volitional) and pointed to the bed cover when I uncovered her to test her mobility.  she was verbally noncommunicative Communication Communication: Other (comment) (nonverbal)    Cognition  Cognition Arousal/Alertness: Awake/alert  Behavior During Therapy: Restless Overall Cognitive Status: History of cognitive impairments - at baseline    Extremity/Trunk Assessment Upper Extremity Assessment Upper Extremity Assessment: RUE deficits/detail;LUE deficits/detail;Difficult to assess due to impaired cognition RUE Deficits / Details: deficits from hr CP , both strength and  ROM LUE Deficits / Details: deficits both strength and ROM due to her CP   Balance Balance Overall balance assessment: Needs assistance;History of Falls Sitting-balance support: Feet unsupported Sitting balance-Leahy Scale: Poor Postural control:  (extensor tonicity in LEs in sitting, L>R)  End of Session PT - End of Session Activity Tolerance: Patient limited by pain;Other (comment) (limited by her baseline level of function) Patient left: in bed;with call bell/phone within reach Nurse Communication: Mobility status;Other (comment) (need for frequent position change)  GP     Ladona Ridgel 01/07/2014, 3:56 PM Gerlean Ren PT Acute Rehab Services [phone] Bee[place] [phone]

## 2014-01-08 LAB — TYPE AND SCREEN
ABO/RH(D): O POS
Antibody Screen: NEGATIVE
UNIT DIVISION: 0
Unit division: 0

## 2014-01-08 LAB — BASIC METABOLIC PANEL
BUN: 12 mg/dL (ref 6–23)
CALCIUM: 8.6 mg/dL (ref 8.4–10.5)
CO2: 29 mEq/L (ref 19–32)
Chloride: 98 mEq/L (ref 96–112)
Creatinine, Ser: 0.42 mg/dL — ABNORMAL LOW (ref 0.50–1.10)
GFR calc Af Amer: >90 mL/min (ref >90)
GFR calc non Af Amer: >90 mL/min (ref >90)
Glucose, Bld: 107 mg/dL — ABNORMAL HIGH (ref 70–99)
Potassium: 3.9 mEq/L (ref 3.7–5.3)
SODIUM: 137 meq/L (ref 137–147)

## 2014-01-08 LAB — CBC
HCT: 30 % — ABNORMAL LOW (ref 36.0–46.0)
Hemoglobin: 10.1 g/dL — ABNORMAL LOW (ref 12.0–15.0)
MCH: 29.3 pg (ref 26.0–34.0)
MCHC: 33.7 g/dL (ref 30.0–36.0)
MCV: 87 fL (ref 78.0–100.0)
Platelets: 419 10*3/uL — ABNORMAL HIGH (ref 150–400)
RBC: 3.45 MIL/uL — AB (ref 3.87–5.11)
RDW: 14.2 % (ref 11.5–15.5)
WBC: 11.7 10*3/uL — ABNORMAL HIGH (ref 4.0–10.5)

## 2014-01-08 NOTE — Progress Notes (Signed)
[facility] PROGRESS NOTE  Domenica FailCarol A Height ZOX:[id number] DOB: [birth year] DOA: 01/04/2014 PCP: No primary provider on file. Patient discharged yesterday but did not leave due [facility] No complaints reported today Filed Vitals:   01/08/14 0800  BP:   Pulse:   Temp:   Resp: 18   Exam:  General: NAD  Cardiovascular: RRR  Respiratory: CTAB  Abdomen: soft, nontender, nondistended, positive bowel sounds.  Musculoskeletal: no clubbing cyanosis or edema  Assessment/Plan:  #1 right hip intertrochanteric fracture.  Status post ORIF ON 3/4, per orthopedics she remains medically ready for discharge.. #2 Escherichia coli urinary tract infection  Urine cultures with Escherichia coli. She is to continue Ceftin on discharge. #3 leukocytosis  Likely secondary to problem #2. Chest x-ray is negative for any acute infiltrates. #4 Cerebral Palsy/Spastic quadriplegia  Stable.  #5 Seizure  #6 Chronic constipation  Laxative    Pahoua Schreiner C  [facility] Pager [phone]. If 7PM-7AM, please contact night-coverage at [email or web address], password [email or web address] 01/08/2014, 9:36 AM  LOS: 4 days

## 2014-01-22 ENCOUNTER — Ambulatory Visit: Payer: Medicare Other

## 2014-01-22 NOTE — Addendum Note (Signed)
Addendum created 01/22/14 1230 by Heather RobertsJames Juleen Sorrels, MD   Modules edited: Anesthesia Responsible Staff

## 2014-02-08 NOTE — Addendum Note (Signed)
Addendum created 02/08/14 1830 by Heather RobertsJames Nishita Isaacks, MD   Modules edited: Anesthesia Responsible Staff

## 2015-11-16 IMAGING — CT CT HIP*R* W/O CM
2 of 4 series · 8 of 46 positions shown, 9 images · non-contrast
Comparison: DG PELVIS 1-2 VIEWS dated 11/27/2010; DG HIP BILATERAL
W/PELVIS dated 12/28/2009

CLINICAL DATA: Hip fracture.

EXAM:
CT OF THE RIGHT HIP WITHOUT CONTRAST
TECHNIQUE: Multidetector CT imaging was performed according to the standard
protocol. Multiplanar CT image reconstructions were also generated.

[Series 8040: sagittal · coronal · 0.33mm/px · 3 of 61 slices shown]
[im 21/61  soft-tissue]
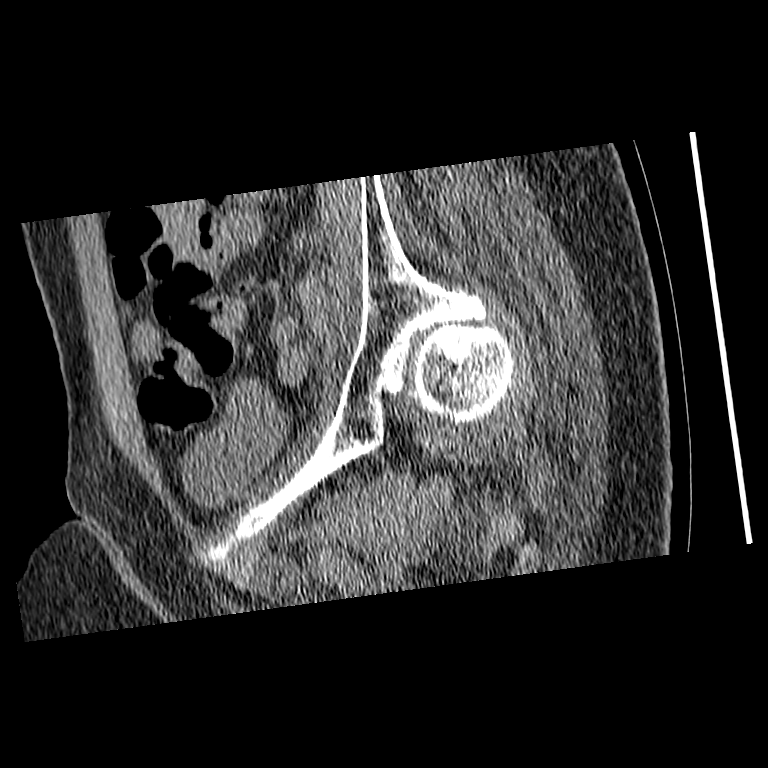
[im 27/61  soft-tissue]
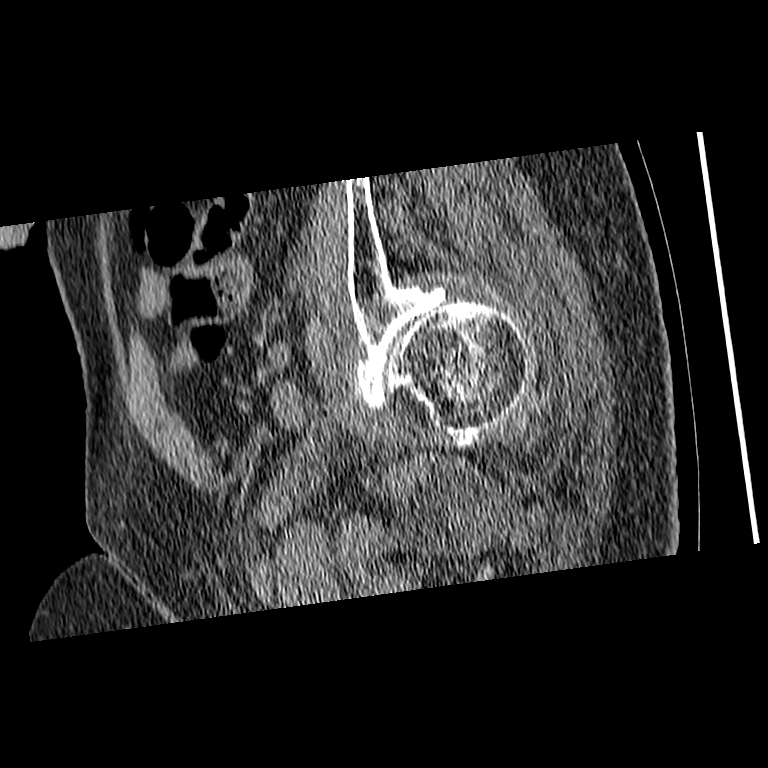
[im 34/61  soft-tissue]
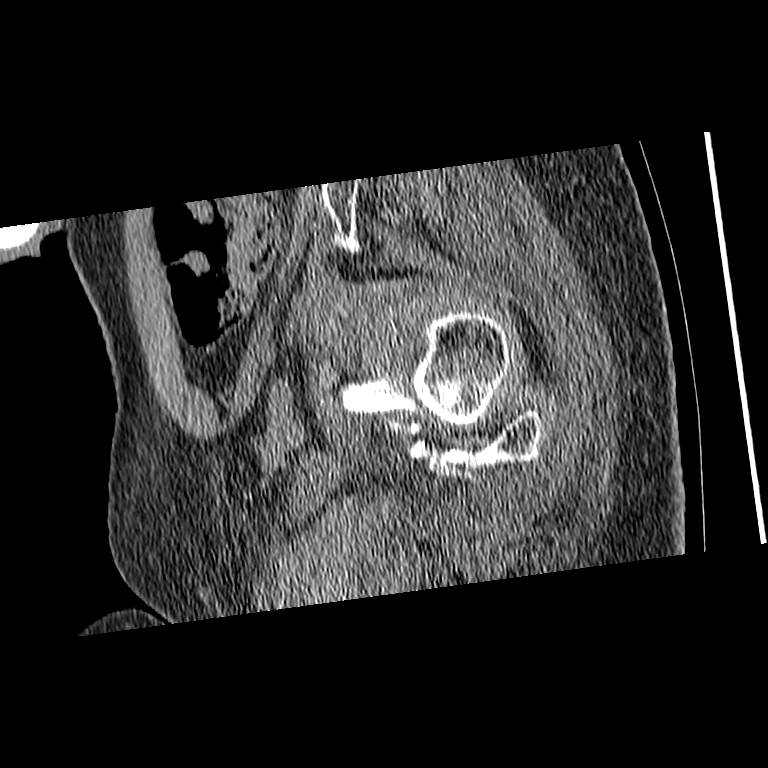

[Series 8042: ang.axials · axial · 0.33mm/px · z∈[-107,-25]mm · 5 of 69 slices shown, 6 images]
[im 10/69  soft-tissue]
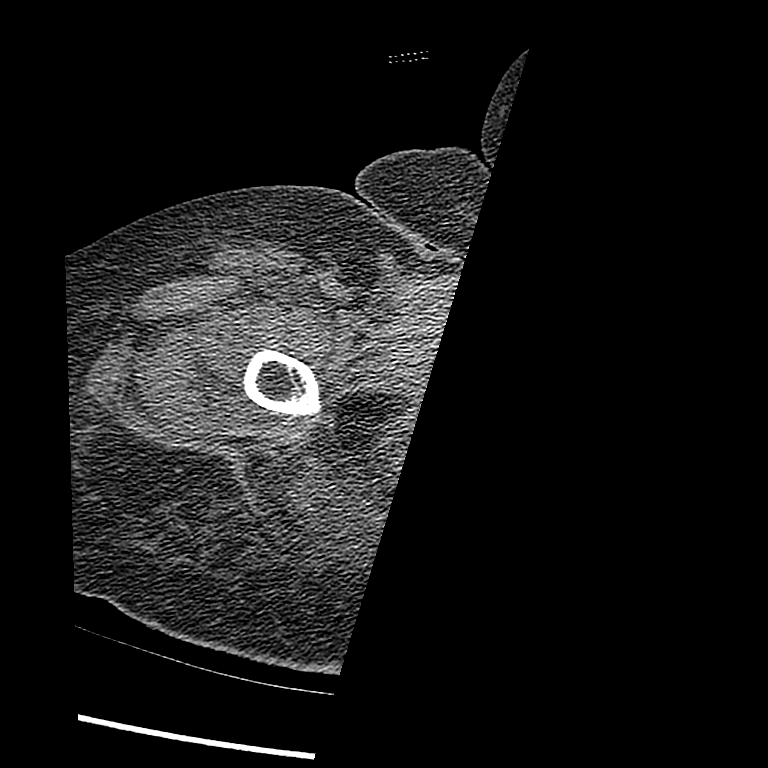
[im 10/69  bone]
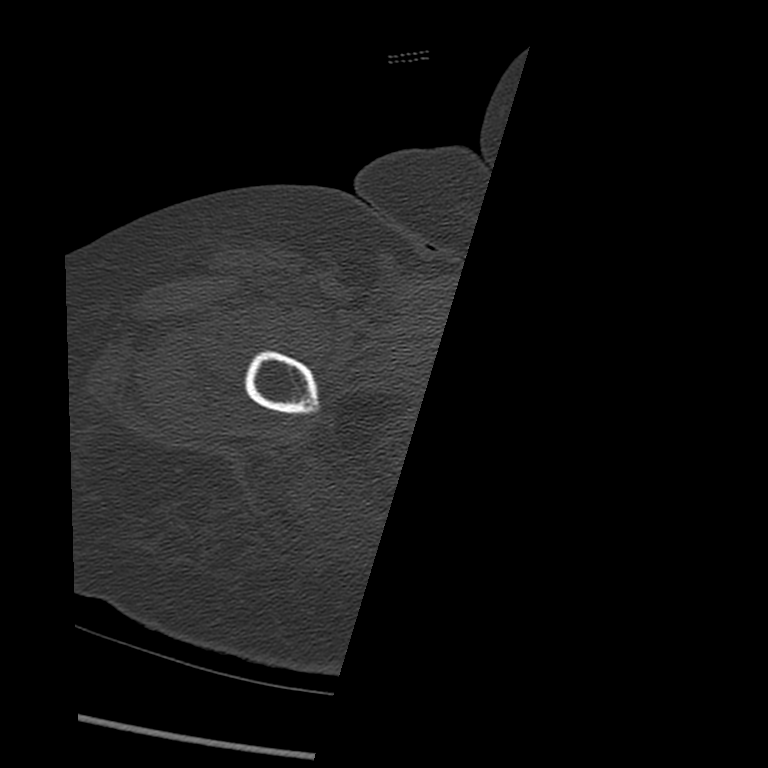
[im 22/69  soft-tissue]
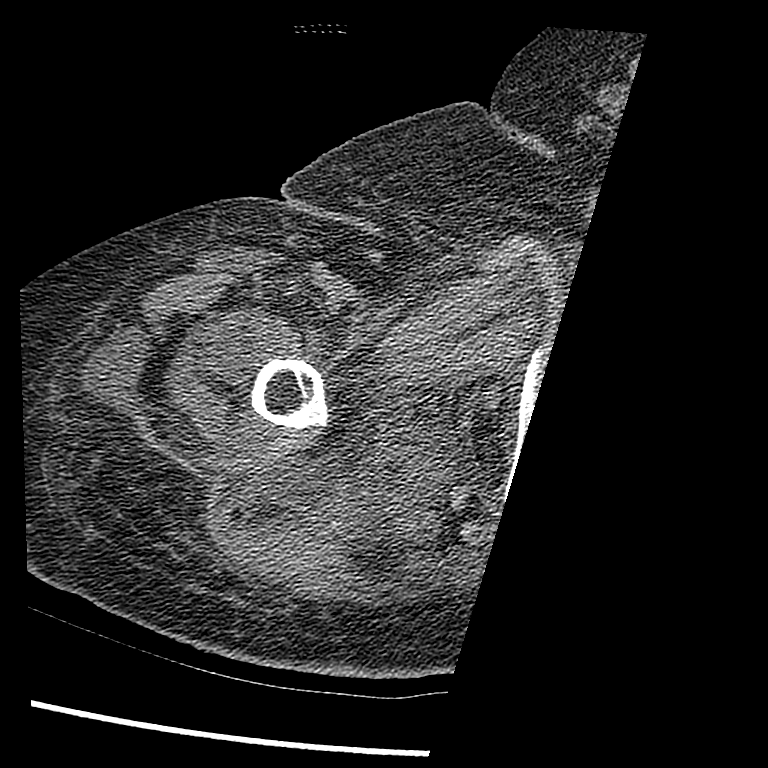
[im 35/69  soft-tissue]
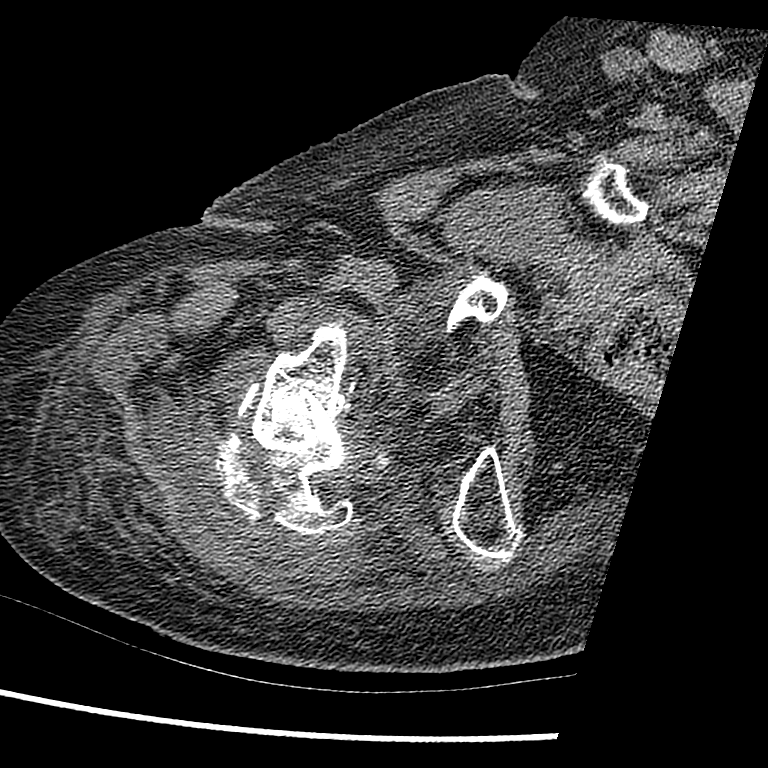
[im 47/69  soft-tissue]
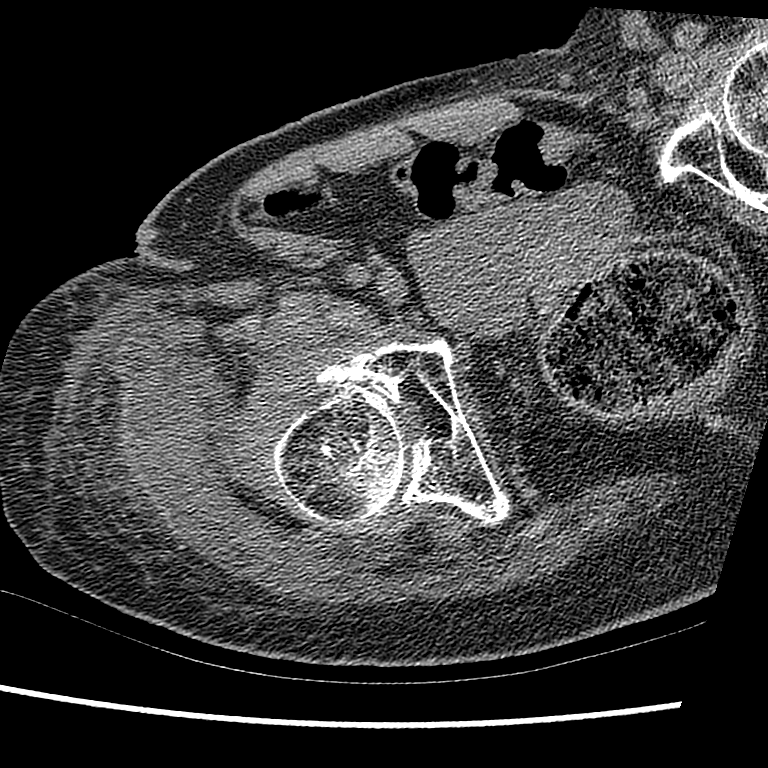
[im 59/69  soft-tissue]
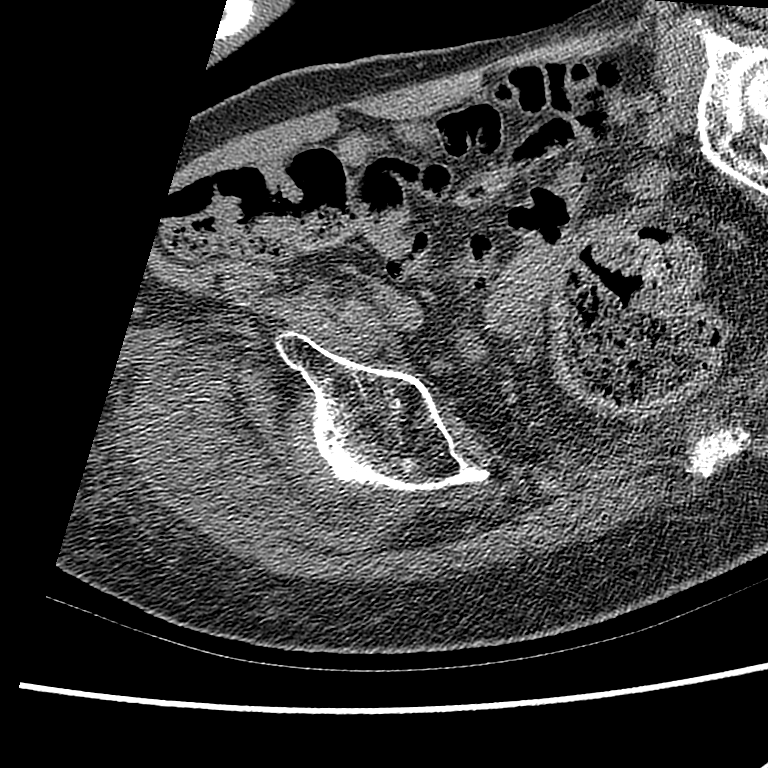

[8 of 46 positions shown; findings below may reference images not displayed]

FINDINGS: Intertrochanteric fracture of the right proximal femur observed with
1.2 cm of anterior displacement of the shaft fragment with suspected
the femoral head fragment. Moderate comminution along the fracture
site.

Dysplastic right acetabulum with mild flattening and irregularity of
the femoral head which appears chronic. Mild hematoma noted along
the gluteal musculature adjacent to the fracture.
IMPRESSION: 1. Intertrochanteric fracture, right proximal femur with moderate
comminution. 1.2 cm anterior displacement of the shaft fragment with
respect to the femoral head fragment.
2. Dysplastic right acetabulum with flattening and irregularity of
the right femoral head.
